# Patient Record
Sex: Female | Born: 1940 | Race: Black or African American | Hispanic: No | Smoking: Never smoker
Health system: Southern US, Community
[De-identification: ages and names within clinical notes are randomized; demographics above are authoritative.]

## PROBLEM LIST (undated history)

## (undated) DIAGNOSIS — Z8489 Family history of other specified conditions: Secondary | ICD-10-CM

## (undated) DIAGNOSIS — J309 Allergic rhinitis, unspecified: Secondary | ICD-10-CM

## (undated) DIAGNOSIS — I4891 Unspecified atrial fibrillation: Secondary | ICD-10-CM

## (undated) DIAGNOSIS — M4800 Spinal stenosis, site unspecified: Secondary | ICD-10-CM

## (undated) DIAGNOSIS — R350 Frequency of micturition: Secondary | ICD-10-CM

## (undated) DIAGNOSIS — M179 Osteoarthritis of knee, unspecified: Secondary | ICD-10-CM

## (undated) DIAGNOSIS — M1710 Unilateral primary osteoarthritis, unspecified knee: Secondary | ICD-10-CM

## (undated) DIAGNOSIS — I10 Essential (primary) hypertension: Secondary | ICD-10-CM

## (undated) DIAGNOSIS — I129 Hypertensive chronic kidney disease with stage 1 through stage 4 chronic kidney disease, or unspecified chronic kidney disease: Secondary | ICD-10-CM

## (undated) DIAGNOSIS — G5602 Carpal tunnel syndrome, left upper limb: Secondary | ICD-10-CM

## (undated) DIAGNOSIS — E039 Hypothyroidism, unspecified: Secondary | ICD-10-CM

## (undated) DIAGNOSIS — D649 Anemia, unspecified: Secondary | ICD-10-CM

## (undated) DIAGNOSIS — IMO0001 Reserved for inherently not codable concepts without codable children: Secondary | ICD-10-CM

## (undated) DIAGNOSIS — N189 Chronic kidney disease, unspecified: Secondary | ICD-10-CM

## (undated) HISTORY — DX: Hypertensive chronic kidney disease with stage 1 through stage 4 chronic kidney disease, or unspecified chronic kidney disease: I12.9

## (undated) HISTORY — DX: Osteoarthritis of knee, unspecified: M17.9

## (undated) HISTORY — DX: Unspecified atrial fibrillation: I48.91

## (undated) HISTORY — DX: Chronic kidney disease, unspecified: N18.9

## (undated) HISTORY — PX: COLONOSCOPY W/ BIOPSIES AND POLYPECTOMY: SHX1376

## (undated) HISTORY — DX: Allergic rhinitis, unspecified: J30.9

## (undated) HISTORY — DX: Spinal stenosis, site unspecified: M48.00

## (undated) HISTORY — DX: Unilateral primary osteoarthritis, unspecified knee: M17.10

## (undated) HISTORY — PX: OTHER SURGICAL HISTORY: SHX169

---

## 1997-05-15 ENCOUNTER — Encounter: Admission: RE | Admit: 1997-05-15 | Discharge: 1997-08-13 | Payer: Self-pay | Admitting: Internal Medicine

## 1997-08-05 ENCOUNTER — Encounter: Admission: RE | Admit: 1997-08-05 | Discharge: 1997-10-24 | Payer: Self-pay | Admitting: Internal Medicine

## 1997-09-01 ENCOUNTER — Encounter: Admission: RE | Admit: 1997-09-01 | Discharge: 1997-11-30 | Payer: Self-pay | Admitting: Internal Medicine

## 1997-10-24 ENCOUNTER — Encounter: Admission: RE | Admit: 1997-10-24 | Discharge: 1998-01-22 | Payer: Self-pay | Admitting: Internal Medicine

## 1997-10-27 ENCOUNTER — Encounter: Admission: RE | Admit: 1997-10-27 | Discharge: 1997-10-27 | Payer: Self-pay | Admitting: Sports Medicine

## 1997-12-23 ENCOUNTER — Encounter: Payer: Self-pay | Admitting: *Deleted

## 1998-06-15 ENCOUNTER — Ambulatory Visit (HOSPITAL_COMMUNITY): Admission: RE | Admit: 1998-06-15 | Discharge: 1998-06-15 | Payer: Self-pay | Admitting: Family Medicine

## 1999-09-29 ENCOUNTER — Encounter: Payer: Self-pay | Admitting: Family Medicine

## 1999-09-29 ENCOUNTER — Encounter: Admission: RE | Admit: 1999-09-29 | Discharge: 1999-09-29 | Payer: Self-pay | Admitting: Family Medicine

## 1999-12-08 ENCOUNTER — Encounter: Payer: Self-pay | Admitting: Orthopedic Surgery

## 1999-12-20 ENCOUNTER — Inpatient Hospital Stay (HOSPITAL_COMMUNITY): Admission: RE | Admit: 1999-12-20 | Discharge: 1999-12-23 | Payer: Self-pay | Admitting: Orthopedic Surgery

## 1999-12-23 ENCOUNTER — Inpatient Hospital Stay (HOSPITAL_COMMUNITY)
Admission: RE | Admit: 1999-12-23 | Discharge: 1999-12-30 | Payer: Self-pay | Admitting: Physical Medicine & Rehabilitation

## 2001-07-14 ENCOUNTER — Inpatient Hospital Stay (HOSPITAL_COMMUNITY): Admission: EM | Admit: 2001-07-14 | Discharge: 2001-07-16 | Payer: Self-pay

## 2001-08-08 ENCOUNTER — Encounter: Payer: Self-pay | Admitting: Family Medicine

## 2001-08-08 ENCOUNTER — Ambulatory Visit (HOSPITAL_COMMUNITY): Admission: RE | Admit: 2001-08-08 | Discharge: 2001-08-08 | Payer: Self-pay | Admitting: Family Medicine

## 2001-10-08 ENCOUNTER — Encounter: Payer: Self-pay | Admitting: Neurosurgery

## 2001-10-08 ENCOUNTER — Ambulatory Visit (HOSPITAL_COMMUNITY): Admission: RE | Admit: 2001-10-08 | Discharge: 2001-10-08 | Payer: Self-pay | Admitting: Neurosurgery

## 2001-10-29 ENCOUNTER — Encounter: Payer: Self-pay | Admitting: Neurosurgery

## 2001-10-29 ENCOUNTER — Ambulatory Visit (HOSPITAL_COMMUNITY): Admission: RE | Admit: 2001-10-29 | Discharge: 2001-10-29 | Payer: Self-pay | Admitting: Neurosurgery

## 2002-04-06 ENCOUNTER — Ambulatory Visit (HOSPITAL_COMMUNITY): Admission: RE | Admit: 2002-04-06 | Discharge: 2002-04-06 | Payer: Self-pay | Admitting: Neurosurgery

## 2002-04-06 ENCOUNTER — Encounter: Payer: Self-pay | Admitting: Neurosurgery

## 2002-12-05 ENCOUNTER — Ambulatory Visit (HOSPITAL_COMMUNITY): Admission: RE | Admit: 2002-12-05 | Discharge: 2002-12-05 | Payer: Self-pay | Admitting: Neurosurgery

## 2002-12-05 ENCOUNTER — Encounter: Payer: Self-pay | Admitting: Neurosurgery

## 2003-06-17 ENCOUNTER — Ambulatory Visit (HOSPITAL_COMMUNITY): Admission: RE | Admit: 2003-06-17 | Discharge: 2003-06-17 | Payer: Self-pay | Admitting: Neurosurgery

## 2003-10-10 ENCOUNTER — Other Ambulatory Visit: Admission: RE | Admit: 2003-10-10 | Discharge: 2003-10-10 | Payer: Self-pay | Admitting: Family Medicine

## 2003-12-12 ENCOUNTER — Ambulatory Visit (HOSPITAL_COMMUNITY): Admission: RE | Admit: 2003-12-12 | Discharge: 2003-12-12 | Payer: Self-pay | Admitting: Gastroenterology

## 2003-12-12 ENCOUNTER — Encounter (INDEPENDENT_AMBULATORY_CARE_PROVIDER_SITE_OTHER): Payer: Self-pay | Admitting: *Deleted

## 2004-06-28 ENCOUNTER — Ambulatory Visit (HOSPITAL_COMMUNITY): Admission: RE | Admit: 2004-06-28 | Discharge: 2004-06-28 | Payer: Self-pay | Admitting: Neurosurgery

## 2005-02-23 ENCOUNTER — Other Ambulatory Visit: Admission: RE | Admit: 2005-02-23 | Discharge: 2005-02-23 | Payer: Self-pay | Admitting: Family Medicine

## 2005-03-10 ENCOUNTER — Ambulatory Visit (HOSPITAL_COMMUNITY): Admission: RE | Admit: 2005-03-10 | Discharge: 2005-03-10 | Payer: Self-pay | Admitting: Family Medicine

## 2005-07-06 ENCOUNTER — Ambulatory Visit (HOSPITAL_COMMUNITY): Admission: RE | Admit: 2005-07-06 | Discharge: 2005-07-06 | Payer: Self-pay | Admitting: Neurosurgery

## 2006-07-04 ENCOUNTER — Ambulatory Visit (HOSPITAL_COMMUNITY): Admission: RE | Admit: 2006-07-04 | Discharge: 2006-07-04 | Payer: Self-pay | Admitting: Neurosurgery

## 2007-03-28 ENCOUNTER — Encounter: Admission: RE | Admit: 2007-03-28 | Discharge: 2007-03-28 | Payer: Self-pay | Admitting: Family Medicine

## 2007-06-22 ENCOUNTER — Ambulatory Visit (HOSPITAL_COMMUNITY): Admission: RE | Admit: 2007-06-22 | Discharge: 2007-06-22 | Payer: Self-pay | Admitting: Neurosurgery

## 2008-04-14 ENCOUNTER — Encounter: Admission: RE | Admit: 2008-04-14 | Discharge: 2008-04-14 | Payer: Self-pay | Admitting: Family Medicine

## 2008-06-17 ENCOUNTER — Other Ambulatory Visit: Admission: RE | Admit: 2008-06-17 | Discharge: 2008-06-17 | Payer: Self-pay | Admitting: Family Medicine

## 2008-06-27 ENCOUNTER — Encounter: Payer: Self-pay | Admitting: Cardiology

## 2008-07-03 ENCOUNTER — Encounter: Payer: Self-pay | Admitting: Cardiology

## 2008-08-13 ENCOUNTER — Ambulatory Visit (HOSPITAL_COMMUNITY): Admission: RE | Admit: 2008-08-13 | Discharge: 2008-08-13 | Payer: Self-pay | Admitting: Neurosurgery

## 2009-04-22 ENCOUNTER — Encounter: Admission: RE | Admit: 2009-04-22 | Discharge: 2009-04-22 | Payer: Self-pay | Admitting: Family Medicine

## 2010-02-19 ENCOUNTER — Encounter (HOSPITAL_BASED_OUTPATIENT_CLINIC_OR_DEPARTMENT_OTHER)
Admission: RE | Admit: 2010-02-19 | Discharge: 2010-03-16 | Payer: Self-pay | Source: Home / Self Care | Attending: General Surgery | Admitting: General Surgery

## 2010-03-19 ENCOUNTER — Encounter (HOSPITAL_BASED_OUTPATIENT_CLINIC_OR_DEPARTMENT_OTHER): Payer: Medicare Other | Attending: General Surgery

## 2010-03-19 DIAGNOSIS — I872 Venous insufficiency (chronic) (peripheral): Secondary | ICD-10-CM | POA: Insufficient documentation

## 2010-03-19 DIAGNOSIS — L97809 Non-pressure chronic ulcer of other part of unspecified lower leg with unspecified severity: Secondary | ICD-10-CM | POA: Insufficient documentation

## 2010-04-16 ENCOUNTER — Encounter (HOSPITAL_BASED_OUTPATIENT_CLINIC_OR_DEPARTMENT_OTHER): Payer: Medicare Other | Attending: General Surgery

## 2010-04-16 DIAGNOSIS — L97809 Non-pressure chronic ulcer of other part of unspecified lower leg with unspecified severity: Secondary | ICD-10-CM | POA: Insufficient documentation

## 2010-04-16 DIAGNOSIS — I872 Venous insufficiency (chronic) (peripheral): Secondary | ICD-10-CM | POA: Insufficient documentation

## 2010-05-24 LAB — CREATININE, SERUM: GFR calc Af Amer: >60 mL/min (ref >60)

## 2010-05-28 ENCOUNTER — Encounter (HOSPITAL_BASED_OUTPATIENT_CLINIC_OR_DEPARTMENT_OTHER): Payer: Medicare Other | Attending: General Surgery

## 2010-05-28 DIAGNOSIS — I872 Venous insufficiency (chronic) (peripheral): Secondary | ICD-10-CM | POA: Insufficient documentation

## 2010-05-28 DIAGNOSIS — L97809 Non-pressure chronic ulcer of other part of unspecified lower leg with unspecified severity: Secondary | ICD-10-CM | POA: Insufficient documentation

## 2010-06-02 ENCOUNTER — Other Ambulatory Visit: Payer: Self-pay | Admitting: Family Medicine

## 2010-06-02 DIAGNOSIS — Z1231 Encounter for screening mammogram for malignant neoplasm of breast: Secondary | ICD-10-CM

## 2010-06-10 ENCOUNTER — Other Ambulatory Visit (HOSPITAL_COMMUNITY): Payer: Self-pay | Admitting: Neurosurgery

## 2010-06-10 DIAGNOSIS — D496 Neoplasm of unspecified behavior of brain: Secondary | ICD-10-CM

## 2010-06-30 ENCOUNTER — Ambulatory Visit
Admission: RE | Admit: 2010-06-30 | Discharge: 2010-06-30 | Disposition: A | Discharge status: Home / Self Care | Payer: Medicare Other | Source: Ambulatory Visit | Attending: Family Medicine | Admitting: Family Medicine

## 2010-06-30 DIAGNOSIS — Z1231 Encounter for screening mammogram for malignant neoplasm of breast: Secondary | ICD-10-CM

## 2010-07-02 NOTE — Discharge Summary (Signed)
[place]. [facility]  Patient:    [name], Jasmine Hamilton Visit Number: [id number] MRN: [id number]          Service Type: MED Location: [phone] Attending Physician:  Osei-Bonsu, George Dictated by:   George Osei-Bonsu, M.D. Admit Date:  07/14/2001 Discharge Date: 07/16/2001                             Discharge Summary  ADDENDUM  HOSPITAL COURSE: Initially the patient was hypotensive when she came in here. Her blood pressure is 118/17 mmHg. She did not have any acute vision changes.  The patient was hypokalemic during the hospitalization with the last potassium being 3.0. She received K-Dur but we could not repeat potassium prior to discharge. We believe that during her follow up in the outpatient setting, her potassium will be within normal limits since this is thought to be related to her vomiting which is now abated. The patient will need outpatient follow up of her potassium level. Dictated by:   George Osei-Bonsu, M.D. Attending Physician:  Osei-Bonsu, George DD:  07/16/01 TD:  07/17/01 Job: [id number]  

## 2010-07-02 NOTE — Discharge Summary (Signed)
[place]. [facility]  Patient:    Jasmine Hamilton, Jasmine Hamilton Visit Number: [id number] MRN: [id number]          Service Type: MED Location: [phone] Attending Physician:  Jackie Plum Dictated by:   Jackie Plum, M.D. Admit Date:  07/14/2001 Discharge Date: 07/16/2001   CC:         [place]. Cliffton Asters, M.D., [facility]   Discharge Summary  DISCHARGE DIAGNOSES: 1. Acute severe vertigo with nausea and vomiting - resolved, probably    secondary to benign positional vertigo.    a. Head CT done on admission was negative for any acute intracranial       process.    b. MRI with MRA done on July 15, 2001 stable for:       (1) Mild age-related atrophy with mild to moderate nonspecific white           matter type changes without any evidence of acute infarct.       (2) A 9 mm enhancing suprasellar lesion.  Followup with 10 section           postcontrast images in all three planes recommended.       (3) Right internal carotid artery stenosis - about 40%.       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 2. Abnormal MRI/MRA - see reports above with respect to 9 mm suprasellar    enhancing lesion.  The patient will need outpatient followup of this    lesion with 10 sections of postcontrast imaging. 3. Right internal carotid artery stenosis, 40%.  The patient will need    intermittent followup of this lesion. 4. Abnormal chest x-ray - admitting chest x-ray notable for hilar fullness.    The patient will need outpatient comparison with PA and lateral films and    chest CT as needed for this abnormality. 5. Hypertension. 6. Leukopenia without any evidence of infection, etiology unclear, outpatient    followup. 7. History of chronic lobar pain. 8. History of degenerative joint disease of the right knee status post total    knee replacement in November 2001. 9. Allergy to CODEINE (tongue swells up).  DISCHARGE MEDICATIONS:  The  patient is to restart all her preadmission medications as she was taking them prior to admission.  In addition, she was started on Antivert 25 mg tablets one to two tablets by mouth two to three times daily for severe vertigo.  ACTIVITIES:  As tolerated.  DIET:  Low salt diet as usual prior to admission.  SPECIAL INSTRUCTIONS:  She is to report to MD if she experiences any worsening of her dizziness.  FOLLOWUP:  Followup appointment will be with Dr. Laurann Montana within 2 weeks. She is to call for appointment.  HISTORY OF PRESENT ILLNESS/HOSPITAL COURSE:  A 70 year old patient was admitted by Dr. Anastasio Auerbach on Jul 14, 2001 after presenting with severe dizziness with vertiginous signature associated with nausea and vomiting without any chest pain or shortness of breath.  There was no motor or sensory deficit.  The patient was treated at the ED with Valium without any relief and was subsequently admitted for further evaluation.  The patient was orthostatic by pulse and her systolic BP was 191/51 on admission.  She was not clinically dehydrated.  Lung exam and cardiac exam were unremarkable.  CNS exam was notable for absence of any acute neurologic deficit.  #1 - VERTIGO:  The patient was admitted to telemetry unit  for symptomatic management of her severe vertigo and also to rule out any possible posterior circulation CVA.  She was initiated on IV fluid supplementation for the orthostatic pulse with good results.  She was started also on Antivert with good resolution of her symptoms over 48 hours of admission.  CT was negative for an acute intracranial pathology as mentioned above but on account of her symptoms, we thought it was prudent to rule out a posterior circulation CVA by MRI which was negative.  The patient describes symptoms to be aggravated by sharp movements of her head.  She also gave history of tinnitus and we believe that the patient may have benign positional vertigo  versus Menieres disease. We hope that this can be evaluated further as outpatient.  We started her on Antivert for symptomatic treatment of her symptomatology.  #2 - ABNORMAL CHEST X-RAY:  During routine evaluation the patients x-rays showed hilar fullness.  Recommend that the patient be evaluated further with PA and lateral chest x-ray as an outpatient and if this is abnormal may be followed up with chest CT as appropriate.  #3 - ABNORMAL MRI/MRA:  On account of her symptoms MRI/MRA was obtained. There was an incidental finding of a suprasellar enhancing lesion as mentioned above.  We recommend outpatient 10 sections with contrast study to evaluate this lesion further.  During hospitalization, the patient did not indicate any evidence of neurologic symptomatology.  She was neurologically intact. Dictated by:   Jackie Plum, M.D. Attending Physician:  Jackie Plum DD:  07/16/01 TD:  07/18/01 Job: [id number]

## 2010-07-02 NOTE — Discharge Summary (Signed)
[facility]  Patient:    Jasmine Hamilton, Jasmine Hamilton                  MRN: [id number] Adm. Date:  [id number] Disc. Date: 12/23/99 Attending:  Loanne Drilling Dictator:   Alexzandrew L. Perkins, P.A.-C.                           Discharge Summary  ADMITTING DIAGNOSES: 1. Osteoarthritis, right knee. 2. Hypertension. 3. Generalized osteoarthritis.  DISCHARGE DIAGNOSES: 1. Osteoarthritis, right knee status post right total knee replacement    arthroplasty. 2. Hypertension. 3. Generalized osteoarthritis. 4. Mild postoperative anemia.  PROCEDURE:  The patient was taken to the OR on December 20, 1999 and underwent a right total knee replacement arthroplasty. Surgeon was Dr. Ollen Gross, assistant Maud Deed, P.A.-C. under spinal anesthesia. A Hemovac drain x 1 placed at the time of surgery. Tourniquet time was 61 minutes at 350 mmHg.  CONSULTS:  Rehabilitation services.  BRIEF HISTORY:  The patient is a 70 year old female with a several year history of progressing right knee pain and varus deformity. She has been seen and evaluated and treated for knee pain on an outpatient conservative basis; however, she has continued to have progressive type pain. X-rays taken in the office show bone on bone abutment with severe medial compartment arthritis. It is felt she has progressed to the point where it is felt she would benefit from undergoing surgical intervention. The risks and benefits of the procedure were discussed with the patient and she elected to proceed with surgery. The patient was subsequently to [facility].  LABORATORY DATA:  CBC on admission showed a hemoglobin of 11.7, hematocrit of 34.0, white cell count 3.6, red cell count 3.97. Serial H&Hs were followed throughout the hospital course. Postop hemoglobin dropped to a level of 9.8 with hematocrit at 28.4. Last noted H&H was 9.9 with a hematocrit of 28.7. PT and PTT on admission were 13.6  and 25 respectively. Serial pro-times followed per Coumadin protocol. Last noted PT and INR were 17.9 with an INR of 1.8. Urinalysis on admission was negative. Blood group type B+.  X-RAYS:  Chest x-ray preoperatively dated December 08, 1999 findings suggest slight cardiomegaly with prominent markings but without acute abnormalities of the chest. EKG dated December 08, 1999 significant for normal sinus rhythm, right bundle branch block, no old tracings to compare. This was confirmed by Dr. Olga Millers.  HOSPITAL COURSE:  The patient was admitted to [facility], taken to the OR and underwent the above stated procedure without complications. The patient tolerated the procedure well and was later transferred to the recovery room and then to the orthopedic floor to continue post care. A Hemovac drain was placed at the time of surgery. The drain was removed on postoperative day 1 without difficulty. Serial H&Hs were followed throughout the hospital course. The patient had a slight drop in hemoglobin; however, remained hemodynamically stable and did not require transfusion during the hospital course. She was placed on Coumadin postoperatively. Physical therapy was ordered postoperatively. The patient was slow to progress with physical therapy only ambulating approximately 20 feet by postoperative day 2 with minimal assists. Rehab consult was called. The patient was seen and evaluated by rehab services. It was felt that she would benefit from undergoing an inpatient rehabilitation stay. Continued to progress. Dressings change was initiated on postoperative day 2, wound was healing well. No complaints. By December 23, 1999  was still progressing very well with therapy. It was noted a bed became available on the [facility]. This was discussed with the patient and she was in agreement with being transferred.  DISCHARGE PLAN:   The patient is to be transferred over to [facility].  DISCHARGE DIAGNOSES:  Please see above.  DISCHARGE MEDICATIONS:  Current medications at the time of transfer include Colace 100 mg p.o. b.i.d., trinsicon 1 p.o. b.i.d., Cardura 4 mg q.d., cozaar 10 mg p.o. q.d., hydrochlorothiazide 25 mg p.o. q.d., E-vista 60 mg p.o. q.d., Toprol XL 50 mg p.o. b.i.d., Tylenol 1 or 2 every 4-6h as needed for pain, Robaxin 500 mg 1 p.o. q. 6h p.r.n. spasm, Coumadin as per pharmacy protocol. This should be used like Mepergan fortis 1 or 2 every 4-6h as needed for pain.  DISCHARGE DIET:  Low sodium diet.  DISCHARGE ACTIVITY:  The patient is weightbearing as tolerated to the right lower extremity. Continue with gait training ambulation. Range of motion, strength and exercise as per physical therapy. Occupational therapy as needed while in the rehab unit. Continue with wound observation and dressing change daily.  FOLLOW-UP:  The patient is to follow-up with Dr. Lequita Halt in the office 2 weeks from the date of surgery or following discharge from the [facility].  DISPOSITION:  [facility].  CONDITION ON DISCHARGE:  Improved. DD:  12/23/99 TD:  12/23/99 Job: [id number]

## 2010-07-02 NOTE — Op Note (Signed)
[facility]  Patient:    Jasmine Hamilton, Jasmine Hamilton                  MRN: [id number] Proc. Date: 12/20/99 Adm. Date:  [id number] Attending:  Ollen Gross V                           Operative Report  PREOPERATIVE DIAGNOSIS:  Osteoarthritis, right knee.  POSTOPERATIVE DIAGNOSIS:  Osteoarthritis, right knee.  OPERATION:  Right total knee arthroplasty.  SURGEON:  Trudee Grip, M.D.  ASSISTANT:  Della Goo, P.A.  ANESTHESIA:  Spinal.  ESTIMATED BLOOD LOSS:  Minimal.  DRAIN:  Hemovac x 1.  TOURNIQUET TIME:  61 minutes at 350 mmHg.  COMPLICATIONS:  None.  CONDITION:  Stable, to recovery.  BRIEF CLINICAL NOTE:  Ms. Bridwell is a 70 year old with a several year history of progressively increasing right knee pain and varus deformity.  She has severe osteoarthritis, medial compartment patellofemoral.  She presents now for right total knee arthroplasty.  PROCEDURE IN DETAIL:  After successful administration of spinal anesthetic, a tourniquet was placed high on her right thigh and right lower extremity prepped and draped in usual sterile fashion.  Extremity was wrapped in Esmarch, knee flexed, and tourniquet inflated to 400 mmHg given the morbid obesity especially involving the thigh.  The incision was made with the knee flexed  with a #10 blade.  The incision is made down to the extensor mechanism.   A fresh blade was used to make a medial parapatellar arthrotomy. Soft tissue over medial proximal tibial subperiosteally elevated to joint line with a knife and into the descending membranous bursa with curved osteotome. Soft tissue over the lateral tibia was also elevated with attention being paid to avoid the patellar tendon and tibial tubercle.  The lateral patellar femoral ligament is cut, patella everted, and knee flexed 90 degrees. ACL and PCL were then removed.  The drill was then used to create a hole in the distal femur.  The canal was then  irrigated and the alignment rod placed in 5 degrees of valgus. Referencing of the posterior condyle, rotation was marked in black pen so as to remove 9 mm from the distal femur.  Distal femoral resection was then made with an oscillating saw.  The sizer block is placed, and size 4 was the most appropriate.  The rotation is placed at 3 degrees external that corresponded with the epicondylar axis.  The anterior posterior block is placed, and the anterior and posterior cuts made.  The tibia [place] subluxed forward and the menisci removed.  Extramedullary tibial alignment guide is placed referencing at the medial aspect of the tibial tubercle and then distally along the tibial crest.  She had a fixed valgus and external rotation deformity of the foot.  Thus, we did not use the second metatarsal axis as it would have placed her in a tremendous amount of varus. The rotation is marked in a black pen so as to remove 10 mm off the nondeficient lateral side.  Resection is made with an oscillating saw.  A size 4 is the most appropriate tibia also.  The intercondylar and chamfer cuts are then made on the femur.  Size 4 posterior stabilized femur is placed with a size 4 tibial tray.  First a 10 mm posterior stabilizer is inserted.  She got full extension, but there was about 2 mm of varus valgus play with the 10, so we  put a 12.5 in, and she had great balance all the way down through full left flexion.   The rotation is marked and corresponds with the tibial crest distally.  The rotation is then marked. The patella is then everted, thickness measured to be 26 cm, and free hand resection taken out of the 15 mm.  The template of 38 mm patella was placed, and the lug holes are drilled.  The trial patella is placed and tracks normally.  The trial tibia and patella are removed, and then the osteophytes are removed at posterior femur with the components in place.  That trial is removed, and then the tibia tray  is placed and pinned and then a keel punch and modular drill utilized to repair the proximal tibia.  Cut bone surfaces are then prepared with pulsatile lavage, and the size 4 tibia, the size 4 femur, and the 38 mm patella are all cemented in place and patella held with a clamp.  A 12.5 mm spacer is placed and knee held in full extension, extruded cement removed.  Once cement is fully hardened, then the permanent size 4, 12.5 mm posterior stabilizing insert is impacted into the tibial tray.  The wound is then copiously irrigated with antibiotic solution.  The tourniquet is let down for a total time of 61 minutes. Minor bleeding is stopped with cautery.   The extensor mechanism is then closed over limb of the Hemovac drain with interrupted #1 PDS.  Subcutaneous tissue is closed in two layers with 2-0 Vicryl and then subcuticular with running 3-0 Monocryl.   Incision is clean and dry, and Steri-Strips and bulky sterile dressing applied.  The drains hooked to suction and knee placed in an immobilizer.  The patient awakened, and transferred to recovery in stable condition.DD:  12/20/99 TD:  12/20/99 Job: [id number]

## 2010-07-02 NOTE — Discharge Summary (Signed)
[place]. [facility]  Patient:    Jasmine Hamilton, Jasmine Hamilton Visit Number: [id number] MRN: [id number]          Service Type: MED Location: [phone] Attending Physician:  Osei-Bonsu, George Dictated by:   George Osei-Bonsu, M.D. Admit Date:  07/14/2001 Discharge Date: 07/16/2001                             Discharge Summary  ADDENDUM  HOSPITAL COURSE: Initially the patient was hypotensive when she came in here. Her blood pressure is 118/17 mmHg. She did not have any acute vision changes.  The patient was hypokalemic during the hospitalization with the last potassium being 3.0. She received K-Dur but we could not repeat potassium prior to discharge. We believe that during her follow up in the outpatient setting, her potassium will be within normal limits since this is thought to be related to her vomiting which is now abated. The patient will need outpatient follow up of her potassium level. Dictated by:   George Osei-Bonsu, M.D. Attending Physician:  Osei-Bonsu, George DD:  07/16/01 TD:  07/17/01 Job: [id number]  

## 2010-07-02 NOTE — Discharge Summary (Signed)
[place]. [facility]  Patient:    Jasmine Hamilton, Jasmine Hamilton Visit Number: [id number] MRN: [id number]          Service Type: MED Location: [phone] Attending Physician:  Osei-Bonsu, George Dictated by:   George Osei-Bonsu, M.D. Admit Date:  07/14/2001 Discharge Date: 07/16/2001                             Discharge Summary  ADDENDUM  HOSPITAL COURSE: Initially the patient was hypotensive when she came in here. Her blood pressure is 118/17 mmHg. She did not have any acute vision changes.  The patient was hypokalemic during the hospitalization with the last potassium being 3.0. She received K-Dur but we could not repeat potassium prior to discharge. We believe that during her follow up in the outpatient setting, her potassium will be within normal limits since this is thought to be related to her vomiting which is now abated. The patient will need outpatient follow up of her potassium level. Dictated by:   George Osei-Bonsu, M.D. Attending Physician:  Osei-Bonsu, George DD:  07/16/01 TD:  07/17/01 Job: [id number]  

## 2010-07-02 NOTE — Discharge Summary (Signed)
[place]. [facility]  Patient:    Jasmine Hamilton, Jasmine Hamilton Visit Number: [id number] MRN: [id number]          Service Type: MED Location: [phone] Attending Physician:  Jackie Plum Dictated by:   Jackie Plum, M.D. Admit Date:  07/14/2001 Discharge Date: 07/16/2001                             Discharge Summary  ADDENDUM  HOSPITAL COURSE: Initially the patient was hypotensive when she came in here. Her blood pressure is 118/17 mmHg. She did not have any acute vision changes.  The patient was hypokalemic during the hospitalization with the last potassium being 3.0. She received K-Dur but we could not repeat potassium prior to discharge. We believe that during her follow up in the outpatient setting, her potassium will be within normal limits since this is thought to be related to her vomiting which is now abated. The patient will need outpatient follow up of her potassium level. Dictated by:   Jackie Plum, M.D. Attending Physician:  Jackie Plum DD:  07/16/01 TD:  07/17/01 Job: [id number]

## 2010-07-02 NOTE — Discharge Summary (Signed)
[place]. [facility]  Patient:    Jasmine Hamilton, FAETH                  MRN: [id number] Adm. Date:  [id number] Disc. Date: [id number] Attending:  Faith Rogue T Dictator:   Dian Situ, PA CC:         Elana Alm. Eliezer Lofts., M.D.  Trudee Grip, M.D.   Discharge Summary  DISCHARGE DIAGNOSES: 1. Status post right total knee replacement. 2. Klebsiella urinary tract infection. 3. Postoperative anemia. 4. Hypokalemia, resolved.  HISTORY OF PRESENT ILLNESS:  Ms. Klepacki is a 70 year old female with history of hypertension, OA right knee, with progressive knee pain.  She has had no relief with conservative therapy and elected to undergo right total knee replacement December 20, 1999, by Dr. Lequita Halt.  Postoperatively, is weightbearing as tolerated and on Coumadin for DVT prophylaxis, with INR trending upwards to 1.8 today.  Postoperatively, pain is currently being controlled with Demerol.  She has had problems with hypokalemia, requiring supplementation.  Last potassium level of December 21, 1999, was at 3.3. Currently, the patient is at min assist for bed mobility, min assist to steady assist for ambulating 20 feet with wide walker.  Rehabilitation consulted with progressive independence goals.  PAST MEDICAL HISTORY: 1. Hypertension. 2. OA.  ALLERGIES:  CODEINE.  SOCIAL HISTORY:  The patient lives with daughter in one-level home with six steps at entry.  Was independent prior to admission.  She does not use any tobacco or alcohol.  HOSPITAL COURSE:  Ms. Meztli Llanas was admitted to rehabilitation on December 23, 1999, for inpatient therapies to consist of PT, OT daily.  Past admission, the patient was maintained on iron supplement for postoperative anemia, Coumadin for DVT prophylaxis.  Initially, she was started with Darvocet for pain control.  However, this provided little relief, and the patient was started on OxyContin CR for more  consistent pain control.  As there was question of urinary retention, PVRs x 3 were checked and have shown low volumes.  A UA/UCS was sent off at admission, and this grew greater than 100,000 colonies of Klebsiella pneumoniae.  She was treated with a seven-day course of Levaquin for this.  Other laboratories at admission showed the patient to be hypokalemic with potassium at 2.6.  She was supplemented, and this has been rechecked, last of December 28, 1999, shows sodium 138, potassium 3.9, chloride 99, CO2 30, BUN 10, creatinine 0.6, glucose 97.  Her H&H has been stable at 9.0 and 26.4.  Stool guaiac x 1 was checked and has been negative.  The patient was maintained on Coumadin for DVT prophylaxis with PT/INR at discharge at 24.0 and 3.0.  The patient was discharged on 5 mg a day of Coumadin to continue for three additional weeks.  Home health R.N. has been arranged to draw pro times, next on January 03, 2000, with the results to Dr. Riley Kill.  The patients knee incision has healed well without any signs or symptoms of infection.  No drainage, no erythema noted.  The patients knee flexion has been an issue during this stay with flexion being limited to 65 degrees.  A home CPM was ordered, and the patient has been encouraged to do her knee exercises to help with quad strengthening.  In terms of ADLs, the patient is modified independent for upper body care, requires some supervision with cues for lower body dressing.  She is modified independent for toileting, modified independent for transfers, modified independent  for ambulating 150 feet with standard walker.  Able to climb eight steps bilaterally with distant supervision.  Follow-up therapies to include home health PT to continue by [facility] services.  On December 30, 1999, the patient is discharged to home.  DISCHARGE MEDICATIONS:  1. Coumadin 5 mg p.o. per day.  2. Cardura 4 mg per day.  3. Hyzaar 50/25 per day.  4.  ______ 60 mg per day.  5. Trinsicon 1 p.o. b.i.d.  6. Pepcid 20 mg b.i.d.  7. Toprol XL 50 mg per day.  8. Oxycodone CR 1 p.o. q.12h., decrease to 1 p.o. per day in seven days.  9. Oxycodone IR 1-2 p.o. q.4-6h. p.r.n. pain. 10. Levaquin 250 mg p.o. per day x 3 days. 11. Nasonex 1 squirt in nostril per day.  ACTIVITY:  To use walker.  DIET:  Regular.  WOUND CARE:  Keep area clean and dry.  FOLLOW-UP:  To follow up with Dr. Lequita Halt for postoperative check.  Follow up with Dr. Riley Kill as needed.DD:  01/10/00 TD:  01/10/00 Job: [id number]

## 2010-07-02 NOTE — H&P (Signed)
[facility]  Patient:    Jasmine Hamilton, Jasmine Hamilton                    MRN: [id number] Adm. Date:  12/20/99 Attending:  Ollen Gross, M.D. Dictator:   Ralene Bathe, P.A. CC:         Elias Else, M.D. [facility], [address]                         History and Physical  DATE OF HISTORY AND PHYSICAL:  December 08, 1999.  CHIEF COMPLAINT:  Right knee pain.  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 She has failed conservative measures, including right knee arthroscopy and clean out versus injections. She had also previously been scheduled for total knee replacement. However, she was having uncontrolled hypertension; and thus, surgery was postponed. She has been followed medically, and her blood pressure stabilized. Therefore, she wishes to proceed. On physical examination, she has significant varus deformity and range of motion is 0 to 105 with marked crepitus. X-rays show bone-on-bone abutment with severe medial component arthritis. Also significant patellofemoral compartment arthritis. Risks, benefits of surgery discussed with the patient. She wishes to proceed with total knee arthroplasty.  PAST MEDICAL HISTORY: 1. Hypertension. 2. Osteoarthritis.  PAST SURGICAL HISTORY:  Right knee arthroscopy.  ALLERGIES:  CODEINE cause tongue swelling.  CURRENT MEDICATIONS: 1. Cardura 4 mg q.h.s. 2. Hyzaar 100/25 mg q.a.m. 3. Toprol XL 50 mg b.i.d. 4. Ultram 50 one q.4-6h. p.r.n. pain. 5. Evista 60 mg q.d. 6. Tylenol p.r.n.  FAMILY HISTORY:  Significant for hypertension, diabetes, and heart disease, as well as strokes.  SOCIAL HISTORY:  The patient lives with her daughter. Lives in a [place] with six steps into her usual entrance. No alcohol use. No tobacco. The patients medical physician is Molly Maduro A. Eliezer Lofts., M.D., [facility] who has been following her for her blood pressure control.  REVIEW OF SYSTEMS:  The patient denies any recent fevers, chills, night sweats. No bleeding tendencies. CNS:  No blurred or double vision, seizures, or paralysis. RESPIRATORY:  No shortness of breath, productive cough, or hemoptysis. CARDIOVASCULAR:  No chest pain, angina, orthopnea. GI:  No nausea, vomiting, constipation, melena, bloody stools. GENITOURINARY:  No dysuria or hematuria. MUSCULOSKELETAL:  As pertinent to present illness.  PHYSICAL EXAMINATION:  GENERAL:  Well-developed, well-nourished, mildly obese, 70 year old female.  VITAL SIGNS:  Blood pressure 160/76 today.  HEENT:  Normocephalic. Extraocular motions intact.  NECK:  Supple. No lymphadenopathy. No carotid bruits.  CHEST:  Clear to auscultation. No rales or rhonchi.  HEART:  Regular rate and rhythm. No murmurs, rubs, gallops, heaves, or thrills.  ABDOMEN:  Obese, soft, nontender.  EXTREMITIES:  Positive distal pulses. Right knee is as in the history of present illness.  IMPRESSION: 1. Osteoarthritis right knee. 2. Hypertension. 3. Osteoarthritis.  PLAN:  Proceed with right total knee arthroplasty at this point. Her medical physician Robert A. Eliezer Lofts., M.D. has provided medical clearance. DD:  12/14/99 TD:  12/14/99 Job: [id number]

## 2010-07-05 ENCOUNTER — Ambulatory Visit (HOSPITAL_COMMUNITY)
Admission: RE | Admit: 2010-07-05 | Discharge: 2010-07-05 | Disposition: A | Discharge status: Home / Self Care | Payer: Medicare Other | Source: Ambulatory Visit | Attending: Neurosurgery | Admitting: Neurosurgery

## 2010-07-05 DIAGNOSIS — D496 Neoplasm of unspecified behavior of brain: Secondary | ICD-10-CM

## 2010-07-05 DIAGNOSIS — D320 Benign neoplasm of cerebral meninges: Secondary | ICD-10-CM | POA: Insufficient documentation

## 2010-07-05 DIAGNOSIS — J3489 Other specified disorders of nose and nasal sinuses: Secondary | ICD-10-CM | POA: Insufficient documentation

## 2010-07-05 DIAGNOSIS — Z09 Encounter for follow-up examination after completed treatment for conditions other than malignant neoplasm: Secondary | ICD-10-CM | POA: Insufficient documentation

## 2010-07-05 MED ORDER — GADOBENATE DIMEGLUMINE 529 MG/ML IV SOLN
20.0000 mL | Freq: Once | INTRAVENOUS | Status: AC
  Administered 2010-07-05: 20 mL via INTRAVENOUS

## 2010-07-06 ENCOUNTER — Other Ambulatory Visit (HOSPITAL_COMMUNITY): Payer: Medicare Other

## 2011-08-03 ENCOUNTER — Other Ambulatory Visit: Payer: Self-pay | Admitting: Family Medicine

## 2011-08-03 DIAGNOSIS — Z1231 Encounter for screening mammogram for malignant neoplasm of breast: Secondary | ICD-10-CM

## 2011-08-10 ENCOUNTER — Ambulatory Visit
Admission: RE | Admit: 2011-08-10 | Discharge: 2011-08-10 | Disposition: A | Discharge status: Home / Self Care | Payer: Medicare Other | Source: Ambulatory Visit | Attending: Family Medicine | Admitting: Family Medicine

## 2011-08-10 DIAGNOSIS — Z1231 Encounter for screening mammogram for malignant neoplasm of breast: Secondary | ICD-10-CM

## 2012-09-18 ENCOUNTER — Other Ambulatory Visit: Payer: Self-pay

## 2012-09-18 DIAGNOSIS — Z1231 Encounter for screening mammogram for malignant neoplasm of breast: Secondary | ICD-10-CM

## 2012-10-05 ENCOUNTER — Ambulatory Visit: Payer: Medicare Other

## 2012-10-25 ENCOUNTER — Ambulatory Visit
Admission: RE | Admit: 2012-10-25 | Discharge: 2012-10-25 | Disposition: A | Discharge status: Home / Self Care | Payer: Medicare Other | Source: Ambulatory Visit

## 2012-10-25 ENCOUNTER — Ambulatory Visit: Payer: Medicare Other

## 2012-10-25 DIAGNOSIS — Z1231 Encounter for screening mammogram for malignant neoplasm of breast: Secondary | ICD-10-CM

## 2012-11-13 ENCOUNTER — Other Ambulatory Visit: Payer: Self-pay | Admitting: Cardiology

## 2012-11-13 DIAGNOSIS — Z79899 Other long term (current) drug therapy: Secondary | ICD-10-CM

## 2012-11-13 DIAGNOSIS — I4891 Unspecified atrial fibrillation: Secondary | ICD-10-CM

## 2012-12-27 ENCOUNTER — Encounter: Payer: Self-pay | Admitting: *Deleted

## 2012-12-27 ENCOUNTER — Encounter: Payer: Self-pay | Admitting: Cardiology

## 2012-12-27 DIAGNOSIS — I4891 Unspecified atrial fibrillation: Secondary | ICD-10-CM | POA: Insufficient documentation

## 2012-12-27 DIAGNOSIS — M1710 Unilateral primary osteoarthritis, unspecified knee: Secondary | ICD-10-CM | POA: Insufficient documentation

## 2012-12-27 DIAGNOSIS — M4800 Spinal stenosis, site unspecified: Secondary | ICD-10-CM | POA: Insufficient documentation

## 2012-12-27 DIAGNOSIS — J309 Allergic rhinitis, unspecified: Secondary | ICD-10-CM | POA: Insufficient documentation

## 2012-12-27 DIAGNOSIS — N189 Chronic kidney disease, unspecified: Secondary | ICD-10-CM | POA: Insufficient documentation

## 2012-12-27 DIAGNOSIS — I129 Hypertensive chronic kidney disease with stage 1 through stage 4 chronic kidney disease, or unspecified chronic kidney disease: Secondary | ICD-10-CM | POA: Insufficient documentation

## 2012-12-28 ENCOUNTER — Ambulatory Visit (INDEPENDENT_AMBULATORY_CARE_PROVIDER_SITE_OTHER): Payer: Medicare Other | Admitting: Cardiology

## 2012-12-28 ENCOUNTER — Encounter: Payer: Self-pay | Admitting: Cardiology

## 2012-12-28 VITALS — BP 140/89 | HR 80 | Ht 63.0 in | Wt 245.0 lb

## 2012-12-28 DIAGNOSIS — I129 Hypertensive chronic kidney disease with stage 1 through stage 4 chronic kidney disease, or unspecified chronic kidney disease: Secondary | ICD-10-CM

## 2012-12-28 DIAGNOSIS — Z7901 Long term (current) use of anticoagulants: Secondary | ICD-10-CM | POA: Insufficient documentation

## 2012-12-28 DIAGNOSIS — I4891 Unspecified atrial fibrillation: Secondary | ICD-10-CM

## 2012-12-28 NOTE — Progress Notes (Signed)
[address] [place], [place]  [address] Phone: [phone] Fax:  [phone]  Date:  12/28/2012   ID:  Jasmine Hamilton, DOB December 12, 1940, MRN [id number]  PCP:  No primary provider on file.   History of Present Illness: Jasmine Hamilton is a 72 y.o. female with atrial fibrillation, persistent, chronic anticoagulation, rate control, obesity, hypertension, chronic edema, knee pain here for followup. NOAC panel normal. I expressed the importance of this medication in stroke prevention. She's had no bleeding problems. She does have edema of her lower extremities which is likely exacerbated slightly by the amlodipine. I did discuss with her low salt, decreasing her fluid intake. She likes tomatoes, crackers with salt in her salads. She has not had any syncope or dizziness with her underlying right bundle branch block. No chest pain, no shortness of breath  Been out of Xarelto. Cost is an issue. She does tend to eat broccoli, green leafy vegetables however. She has gained some weight. She does feel some shortness of breath with activity.   Wt Readings from Last 3 Encounters:  12/28/12 245 lb (111.131 kg)     Past Medical History  Diagnosis Date  . A-fib     -newly discovered 12/28/09 - rate controlled, Dr Dione Housekeeper  . 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 CKD (chronic kidney disease)   . Allergic rhinitis   . Spinal stenosis   . OA (osteoarthritis) of knee     Past Surgical History  Procedure Laterality Date  . Knee replacement- right knee, dr Despina Hick      Current Outpatient Prescriptions  Medication Sig Dispense Refill  . amLODipine (NORVASC) 5 MG tablet Take 5 mg by mouth daily.      . carvedilol (COREG) 25 MG tablet Take 25 mg by mouth 2 (two) times daily with a meal.      . doxazosin (CARDURA) 2 MG tablet Take 2 mg by mouth daily.      . furosemide (LASIX) 40 MG tablet Take 40  mg by mouth.      . Multiple Vitamin (MULTIVITAMIN) capsule Take 1 capsule by mouth daily.      . traMADol-acetaminophen (ULTRACET) 37.5-325 MG per tablet       . valsartan (DIOVAN) 320 MG tablet Take 320 mg by mouth daily.      Carlena Hurl 20 MG TABS tablet       . ZOSTAVAX [id number] UNT/0.65ML injection        No current facility-administered medications for this visit.    Allergies:   No Known Allergies  Social History:  The patient  reports that she has never smoked. She does not have any smokeless tobacco history on file.   ROS:  Please see the history of present illness.   Denies any syncope, bleeding, orthopnea, PND  PHYSICAL EXAM: VS:  Ht 5\' 3"  (1.6 m)  Wt 245 lb (111.131 kg)  BMI 43.41 kg/m2 Well nourished, well developed, in no acute distress HEENT: normal Neck: no JVD Cardiac:  Irregularly irregular, normal rate; no murmur Lungs:  clear to auscultation bilaterally, no wheezing, rhonchi or rales Abd: soft, nontender, no hepatomegalyObese Ext: no edema Skin: warm and dry Neuro: no focal abnormalities noted  EKG:   Previously atrial fibrillation    ASSESSMENT AND PLAN:  1. Atrial fibrillation-persistent-deserves anticoagulation. We discussed this at length. She has not been taking Xarelto because of cost.  We discussed Coumadin. She does admit that she eats green leafy vegetables/broccoli quite often. I would like for her to discuss her options with pharmacy. 2. Obesity-encourage weight loss. 3. Anticoagulation-as discussed above. 4. Hypertension-continue with current regimen.  Signed, Donato Schultz, MD [facility]  12/28/2012 12:11 PM

## 2012-12-28 NOTE — Patient Instructions (Addendum)
Your physician recommends that you continue on your current medications as directed. Please refer to the Current Medication list given to you today.  Your physician recommends that you return for lab work in: 2 months , Lipid, TSH, BMET  Your physician wants you to follow-up in: 6 months with Dr. Anne Fu. You will receive a reminder letter in the mail two months in advance. If you don't receive a letter, please call our office to schedule the follow-up appointment.

## 2013-01-16 ENCOUNTER — Telehealth: Payer: Self-pay | Admitting: *Deleted

## 2013-01-16 NOTE — Telephone Encounter (Signed)
Patient called requesting eliquis samples and a patient assistance form. Patient aware that these will be left at the front for pick up.

## 2013-01-31 ENCOUNTER — Telehealth: Payer: Self-pay | Admitting: *Deleted

## 2013-01-31 NOTE — Telephone Encounter (Signed)
Provided patient with xarelto samples for 3 weeks. Patient states she delivered her patient care assistance form for help with payment on xarelto, will send note to Dr Anne Fu MA.

## 2013-01-31 NOTE — Telephone Encounter (Signed)
Correction. xarelto samples not eliquis.

## 2013-02-20 ENCOUNTER — Telehealth: Payer: Self-pay | Admitting: Cardiology

## 2013-02-20 NOTE — Telephone Encounter (Signed)
New problem   Pt need to speak to nurse concerning her medication assistance. Please call pt b/c she has ran out of medication.

## 2013-02-21 NOTE — Telephone Encounter (Signed)
Patient has called again for samples of xarelto, will send another note to Phineas Inches to check on status of patient assistance program for xarelto through [employer].

## 2013-02-21 NOTE — Telephone Encounter (Signed)
Please see prior note from December regarding PAP for xarelto through [employer]. Called [employer] today and they do not have this patients application. Will provide patient samples and get her to complete another application for xarelto assistance.

## 2013-02-22 NOTE — Telephone Encounter (Signed)
Signed by Dr. Marlou Porch on 02/22/2013 and placed on Maudie Mercury, RN  Desk to be faxed or mailed.

## 2013-02-27 ENCOUNTER — Ambulatory Visit (INDEPENDENT_AMBULATORY_CARE_PROVIDER_SITE_OTHER): Payer: Medicare Other | Admitting: *Deleted

## 2013-02-27 DIAGNOSIS — I129 Hypertensive chronic kidney disease with stage 1 through stage 4 chronic kidney disease, or unspecified chronic kidney disease: Secondary | ICD-10-CM

## 2013-02-27 DIAGNOSIS — N189 Chronic kidney disease, unspecified: Secondary | ICD-10-CM

## 2013-02-27 DIAGNOSIS — I4891 Unspecified atrial fibrillation: Secondary | ICD-10-CM

## 2013-02-27 LAB — BASIC METABOLIC PANEL
BUN: 12 mg/dL (ref 6–23)
CALCIUM: 9.2 mg/dL (ref 8.4–10.5)
CO2: 27 meq/L (ref 19–32)
CREATININE: 0.7 mg/dL (ref 0.4–1.2)
Chloride: 102 mEq/L (ref 96–112)
GFR: 111.15 mL/min (ref >60.00)
GLUCOSE: 83 mg/dL (ref 70–99)
Potassium: 4 mEq/L (ref 3.5–5.1)
Sodium: 137 mEq/L (ref 135–145)

## 2013-02-27 LAB — LIPID PANEL
CHOL/HDL RATIO: 2
Cholesterol: 173 mg/dL (ref 0–200)
HDL: 93.8 mg/dL (ref >39.00)
LDL Cholesterol: 71 mg/dL (ref 0–99)
Triglycerides: 41 mg/dL (ref 0.0–149.0)
VLDL: 8.2 mg/dL (ref 0.0–40.0)

## 2013-02-27 LAB — TSH: TSH: 0.49 u[IU]/mL (ref 0.35–5.50)

## 2013-03-01 ENCOUNTER — Telehealth: Payer: Self-pay | Admitting: Cardiology

## 2013-03-01 NOTE — Telephone Encounter (Signed)
New message ° ° ° ° ° °Returning a nurses call from yesterday °

## 2013-03-07 NOTE — Telephone Encounter (Signed)
Spoke with patient advised of labs prior result note for labs

## 2013-03-13 ENCOUNTER — Telehealth: Payer: Self-pay | Admitting: Cardiology

## 2013-03-13 NOTE — Telephone Encounter (Signed)
Patient assistance forms

## 2013-03-25 ENCOUNTER — Telehealth: Payer: Self-pay | Admitting: *Deleted

## 2013-03-25 NOTE — Telephone Encounter (Signed)
Patient is out of xarelto, I offered her to come pick up samples. She will come today.

## 2013-04-15 ENCOUNTER — Other Ambulatory Visit: Payer: Self-pay | Admitting: *Deleted

## 2013-04-15 MED ORDER — RIVAROXABAN 20 MG PO TABS: 20.0000 mg | ORAL_TABLET | Freq: Every day | ORAL | Status: DC

## 2013-04-16 ENCOUNTER — Telehealth: Payer: Self-pay | Admitting: *Deleted

## 2013-04-16 NOTE — Telephone Encounter (Signed)
Patient stated that xarelto patient assistance was denied.

## 2013-05-09 ENCOUNTER — Other Ambulatory Visit: Payer: Medicare Other

## 2013-05-10 ENCOUNTER — Other Ambulatory Visit (INDEPENDENT_AMBULATORY_CARE_PROVIDER_SITE_OTHER): Payer: Medicare Other

## 2013-05-10 DIAGNOSIS — I4891 Unspecified atrial fibrillation: Secondary | ICD-10-CM

## 2013-05-10 DIAGNOSIS — Z79899 Other long term (current) drug therapy: Secondary | ICD-10-CM

## 2013-05-10 LAB — CREATININE, SERUM: CREATININE: 0.7 mg/dL (ref 0.4–1.2)

## 2013-05-10 LAB — CBC
HCT: 38.1 % (ref 36.0–46.0)
Hemoglobin: 12.7 g/dL (ref 12.0–15.0)
MCHC: 33.5 g/dL (ref 30.0–36.0)
MCV: 91.6 fl (ref 78.0–100.0)
Platelets: 226 10*3/uL (ref 150.0–400.0)
RBC: 4.16 Mil/uL (ref 3.87–5.11)
RDW: 14 % (ref 11.5–14.6)
WBC: 3.3 10*3/uL — ABNORMAL LOW (ref 4.5–10.5)

## 2013-05-16 ENCOUNTER — Other Ambulatory Visit: Payer: Self-pay | Admitting: Cardiology

## 2013-05-16 MED ORDER — RIVAROXABAN 20 MG PO TABS: 20.0000 mg | ORAL_TABLET | Freq: Every day | ORAL | Status: DC

## 2013-06-27 ENCOUNTER — Ambulatory Visit: Payer: Medicare Other | Admitting: Cardiology

## 2013-07-15 ENCOUNTER — Encounter: Payer: Self-pay | Admitting: Cardiology

## 2013-07-15 ENCOUNTER — Ambulatory Visit (INDEPENDENT_AMBULATORY_CARE_PROVIDER_SITE_OTHER): Payer: Medicare Other | Admitting: Cardiology

## 2013-07-15 VITALS — BP 181/105 | HR 86 | Ht 63.0 in | Wt 237.1 lb

## 2013-07-15 DIAGNOSIS — Z7901 Long term (current) use of anticoagulants: Secondary | ICD-10-CM

## 2013-07-15 DIAGNOSIS — I4891 Unspecified atrial fibrillation: Secondary | ICD-10-CM

## 2013-07-15 NOTE — Patient Instructions (Signed)
Your physician recommends that you schedule a follow-up appointment in: 3 months with a Physician Extender  Your Physician recommends you decrease your salt intake   Your physician recommends that you continue on your current medications as directed. Please refer to the Current Medication list given to you today.

## 2013-07-15 NOTE — Progress Notes (Signed)
[place]. [address] [place], [place]  [address] Phone: [phone] Fax:  [phone]  Date:  07/15/2013   ID:  Jasmine Hamilton, DOB 20-Jul-1940, MRN [id number]  PCP:  No primary provider on file.   History of Present Illness: Jasmine Hamilton is a 73 y.o. female with atrial fibrillation, persistent, chronic anticoagulation, rate control, obesity, hypertension, chronic edema, knee pain here for followup. NOAC panel normal. I expressed the importance of this medication in stroke prevention. She's had no bleeding problems. She does have edema of her lower extremities which is likely exacerbated slightly by the amlodipine. I have discussed with her low salt diet, decreasing her fluid intake. She likes tomatoes, crackers with salt in her salads. She has not had any syncope or dizziness with her underlying right bundle branch block. No chest pain, no shortness of breath  Been out of Xarelto off and on. Cost is an issue. She does tend to eat broccoli, green leafy vegetables however. She has gained some weight. She does feel some shortness of breath with activity chronically.  BP is up today. She admits that she has not been taking her furosemide. She also admits to dietary indiscretion such as hot dogs, salt, chips.   Wt Readings from Last 3 Encounters:  07/15/13 237 lb 1.9 oz (107.557 kg)  12/28/12 245 lb (111.131 kg)     Past Medical History  Diagnosis Date  . A-fib     -newly discovered 12/28/09 - rate controlled, Dr Etter Sjogren  . 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 CKD (chronic kidney disease)   . Allergic rhinitis   . Spinal stenosis   . OA (osteoarthritis) of knee     Past Surgical History  Procedure Laterality Date  . Knee replacement- right knee, dr Maureen Ralphs      Current Outpatient Prescriptions  Medication Sig Dispense Refill  . amLODipine (NORVASC) 5 MG tablet Take 5 mg by mouth daily.       . carvedilol (COREG) 25 MG tablet Take 25 mg by mouth 2 (two) times daily with a meal.      . doxazosin (CARDURA) 2 MG tablet Take 2 mg by mouth daily.      . furosemide (LASIX) 40 MG tablet Take 40 mg by mouth.      . Multiple Vitamin (MULTIVITAMIN) capsule Take 1 capsule by mouth daily.      . potassium chloride SA (K-DUR,KLOR-CON) 20 MEQ tablet Take 1 tablet by mouth daily.      . Rivaroxaban (XARELTO) 20 MG TABS tablet Take 1 tablet (20 mg total) by mouth daily with supper.  30 tablet  3  . traMADol-acetaminophen (ULTRACET) 37.5-325 MG per tablet every 4 (four) hours as needed.       . valsartan (DIOVAN) 320 MG tablet Take 320 mg by mouth daily.       No current facility-administered medications for this visit.    Allergies:   No Known Allergies  Social History:  The patient  reports that she has never smoked. She does not have any smokeless tobacco history on file.   ROS:  Please see the history of present illness.   Denies any syncope, bleeding, orthopnea, PND  PHYSICAL EXAM: VS:  BP 181/105  Pulse 86  Ht 5\' 3"  (1.6 m)  Wt 237 lb 1.9 oz (107.557 kg)  BMI 42.01 kg/m2148/98 on repeat. Well nourished, well developed, in  no acute distress HEENT: normal Neck: no JVD Cardiac:  Irregularly irregular, normal rate; no murmur Lungs:  clear to auscultation bilaterally, no wheezing, rhonchi or rales Abd: soft, nontender, no hepatomegalyObese Ext: no edema Skin: warm and dry Neuro: no focal abnormalities noted  EKG:   Previously atrial fibrillation    ASSESSMENT AND PLAN:  1. Atrial fibrillation-persistent-deserves anticoagulation. We discussed this at length. She has been off and on Xarelto because of cost. We have discussed Coumadin in the past. She does admit that she eats green leafy vegetables/broccoli quite often. I would like for her to discuss her options with pharmacy. 2. Obesity-encourage weight loss. 3. Anticoagulation-as discussed above. 4. Hypertension-highly  elevated today. Reports dietary indiscretion. I do think that if she were to take her Lasix on a daily basis to help with her over intravascular volume, this would help out significantly with her blood pressure.  Signed, Candee Furbish, MD [facility]  07/15/2013 4:53 PM

## 2013-09-09 ENCOUNTER — Other Ambulatory Visit: Payer: Self-pay | Admitting: Cardiology

## 2013-10-23 ENCOUNTER — Encounter: Payer: Self-pay | Admitting: Cardiology

## 2013-10-23 ENCOUNTER — Ambulatory Visit (INDEPENDENT_AMBULATORY_CARE_PROVIDER_SITE_OTHER): Payer: Medicare Other | Admitting: Cardiology

## 2013-10-23 VITALS — BP 140/80 | HR 83 | Ht 63.5 in | Wt 236.0 lb

## 2013-10-23 DIAGNOSIS — I4891 Unspecified atrial fibrillation: Secondary | ICD-10-CM

## 2013-10-23 DIAGNOSIS — Z7901 Long term (current) use of anticoagulants: Secondary | ICD-10-CM

## 2013-10-23 DIAGNOSIS — E669 Obesity, unspecified: Secondary | ICD-10-CM

## 2013-10-23 DIAGNOSIS — I10 Essential (primary) hypertension: Secondary | ICD-10-CM

## 2013-10-23 DIAGNOSIS — I4819 Other persistent atrial fibrillation: Secondary | ICD-10-CM

## 2013-10-23 NOTE — Patient Instructions (Signed)
The current medical regimen is effective;  continue present plan and medications.  Follow up in 6 months with Dr. Skains.  You will receive a letter in the mail 2 months before you are due.  Please call us when you receive this letter to schedule your follow up appointment.  
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
## 2013-10-29 ENCOUNTER — Other Ambulatory Visit: Payer: Self-pay

## 2013-10-29 DIAGNOSIS — Z1231 Encounter for screening mammogram for malignant neoplasm of breast: Secondary | ICD-10-CM

## 2013-11-11 ENCOUNTER — Other Ambulatory Visit: Payer: Self-pay | Admitting: Cardiology

## 2013-11-13 ENCOUNTER — Ambulatory Visit: Payer: Medicare Other

## 2013-11-27 ENCOUNTER — Other Ambulatory Visit: Payer: Self-pay | Admitting: Family Medicine

## 2013-11-27 DIAGNOSIS — M542 Cervicalgia: Secondary | ICD-10-CM

## 2013-11-29 ENCOUNTER — Ambulatory Visit: Payer: Medicare Other

## 2013-12-06 ENCOUNTER — Ambulatory Visit
Admission: RE | Admit: 2013-12-06 | Discharge: 2013-12-06 | Disposition: A | Discharge status: Home / Self Care | Payer: Medicare Other | Source: Ambulatory Visit | Attending: Family Medicine | Admitting: Family Medicine

## 2013-12-06 DIAGNOSIS — M542 Cervicalgia: Secondary | ICD-10-CM

## 2014-01-14 ENCOUNTER — Other Ambulatory Visit: Payer: Self-pay | Admitting: Neurosurgery

## 2014-01-14 DIAGNOSIS — D329 Benign neoplasm of meninges, unspecified: Secondary | ICD-10-CM

## 2014-02-17 ENCOUNTER — Ambulatory Visit
Admission: RE | Admit: 2014-02-17 | Discharge: 2014-02-17 | Disposition: A | Discharge status: Home / Self Care | Payer: Medicare Other | Source: Ambulatory Visit | Attending: Neurosurgery | Admitting: Neurosurgery

## 2014-02-17 DIAGNOSIS — D329 Benign neoplasm of meninges, unspecified: Secondary | ICD-10-CM

## 2014-02-17 MED ORDER — GADOBENATE DIMEGLUMINE 529 MG/ML IV SOLN
20.0000 mL | Freq: Once | INTRAVENOUS | Status: AC | PRN
  Administered 2014-02-17: 20 mL via INTRAVENOUS

## 2014-03-10 ENCOUNTER — Encounter: Payer: Self-pay | Admitting: Neurology

## 2014-03-10 ENCOUNTER — Ambulatory Visit (INDEPENDENT_AMBULATORY_CARE_PROVIDER_SITE_OTHER): Payer: Medicare Other | Admitting: Neurology

## 2014-03-10 VITALS — BP 122/76 | HR 90 | Resp 16 | Wt 229.0 lb

## 2014-03-10 DIAGNOSIS — R29898 Other symptoms and signs involving the musculoskeletal system: Secondary | ICD-10-CM

## 2014-03-10 DIAGNOSIS — M79602 Pain in left arm: Secondary | ICD-10-CM

## 2014-03-10 DIAGNOSIS — R200 Anesthesia of skin: Secondary | ICD-10-CM

## 2014-03-10 LAB — VITAMIN B12: Vitamin B-12: 736 pg/mL (ref 211–911)

## 2014-03-10 LAB — CK: CK TOTAL: 160 U/L (ref 7–177)

## 2014-03-10 NOTE — Patient Instructions (Signed)
I'm not sure why you are weak at this time. 1.  First we will check blood work 2.  If unremarkable, will check another nerve conduction study. 3.  We may need to get MRI of the mid and lower back as well. 4.  I don't think the arm pain is nerve related.  You may want to see an orthopedist.

## 2014-03-10 NOTE — Progress Notes (Signed)
NEUROLOGY CONSULTATION NOTE  LUN MURO MRN: [id number] DOB: 09-08-1940  Referring provider: Dr. Vertell Limber Primary care provider: Dr. Dema Severin  Reason for consult:  Weakness in the legs and numbness.  HISTORY OF PRESENT ILLNESS: Jasmine Hamilton is a 74 year old right-handed woman with persistent atrial fibrillation, chronic anticoagulation, morbid obesity, hypertension, chronic edema, CKD, spinal stenosis, and osteoarthritis of knee and right knee replacement who presents for leg weakness.  Records, labs and MRIs reviewed.  She reports that symptoms began after receiving the flu shot in October.  She noted pain in the left shoulder and upper arm.  This later progressed to numbness and tingling involving all the fingers in the left hand.  Suspecting carpal tunnel syndrome, she was advised to wear a wrist splint.  When symptoms got worse, she was referred to neurosurgery.  An MRI of the cervical spine as performed on 12/07/13, which showed spondylosis and degenerative disc disease causing prominent impingement at C5-6 and C7-T1, and moderate impingement at C2-3, C3-4, C4-5, C6-7 and T1-2.  Equivocal edema or myelomalacia in the cord is seen at the C4-5 level.  Moderate central canal narrowing with disc bulge noted.  She had a NCV-EMG of the upper extremities performed on 01/03/14, which was unremarkable except for subtle findings suggesting bilateral C7 and C8 radiculopathy.  She was referred to PT at the end of November.  White in PT, she began to have gradual weakness of the legs, to the point that she was unable to walk without assistance.  She also developed numbness in her feet as well.  She denies back pain.  She previously had pain in the legs which has since resolved.  She denies any changes in bladder or bowel function.  She has a history of a skull-based meningioma.  Her most recent MRI of the brain with and without contrast was performed on 02/17/14, which revealed 10 x 10 x 6 mm  meningioma arising left of midline at the tuberculum sella, stable compared to prior study from 2012.  However, there is white matter signal change in the pons.  The IV was given in her right arm.  Since the MRI, she now reports numbness in the right hand as well.  Labs from October and November include cholesterol 194, TG 60, LDL 90, TSH 0.45, HGB 13.4, HCT 41.3, PLT 242  PAST MEDICAL HISTORY: Past Medical History  Diagnosis Date  . A-fib     -newly discovered 12/28/09 - rate controlled, Dr Etter Sjogren  . 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 CKD (chronic kidney disease)   . Allergic rhinitis   . Spinal stenosis   . OA (osteoarthritis) of knee     PAST SURGICAL HISTORY: Past Surgical History  Procedure Laterality Date  . Knee replacement- right knee, dr Maureen Ralphs      MEDICATIONS: Current Outpatient Prescriptions on File Prior to Visit  Medication Sig Dispense Refill  . amLODipine (NORVASC) 5 MG tablet Take 5 mg by mouth daily.    . carvedilol (COREG) 25 MG tablet Take 25 mg by mouth 2 (two) times daily with a meal.    . doxazosin (CARDURA) 2 MG tablet Take 2 mg by mouth daily.    . furosemide (LASIX) 40 MG tablet Take 40 mg by mouth.    . Multiple Vitamin (MULTIVITAMIN) capsule Take 1 capsule by mouth daily.    . potassium chloride SA (K-DUR,KLOR-CON) 20 MEQ tablet Take 1  tablet by mouth daily.    . traMADol-acetaminophen (ULTRACET) 37.5-325 MG per tablet every 4 (four) hours as needed.     . valsartan (DIOVAN) 320 MG tablet Take 320 mg by mouth daily.    Alveda Reasons 20 MG TABS tablet take 1 tablet by mouth once daily WITH SUPPER 30 tablet 5   No current facility-administered medications on file prior to visit.    ALLERGIES: Allergies  Allergen Reactions  . Codeine Swelling  . Lotensin [Benazepril Hcl] Cough  . Verelan [Verapamil Hcl Er] Swelling    FAMILY HISTORY: Family History  Problem Relation Age of  Onset  . Hypertension Mother   . Diabetes Mother   . Diabetes Brother   . Heart attack Brother   . Kidney disease Sister   . Stroke Mother   . Fainting Father     SOCIAL HISTORY: History   Social History  . Marital Status: Widowed    Spouse Name: N/A    Number of Children: N/A  . Years of Education: N/A   Occupational History  . Not on file.   Social History Main Topics  . Smoking status: Never Smoker   . Smokeless tobacco: Not on file  . Alcohol Use: No  . Drug Use: No  . Sexual Activity: Not on file   Other Topics Concern  . Not on file   Social History Narrative    REVIEW OF SYSTEMS: Constitutional: No fevers, chills, or sweats, no generalized fatigue, change in appetite Eyes: No visual changes, double vision, eye pain Ear, nose and throat: No hearing loss, ear pain, nasal congestion, sore throat Cardiovascular: No chest pain, palpitations Respiratory:  No shortness of breath at rest or with exertion, wheezes GastrointestinaI: No nausea, vomiting, diarrhea, abdominal pain, fecal incontinence Genitourinary:  No dysuria, urinary retention or frequency Musculoskeletal:  No neck pain, back pain Integumentary: Discoloration and swelling in ankles. Neurological: as above Psychiatric: No depression, insomnia, anxiety Endocrine: No palpitations, fatigue, diaphoresis, mood swings, change in appetite, change in weight, increased thirst Hematologic/Lymphatic:  No anemia, purpura, petechiae. Allergic/Immunologic: no itchy/runny eyes, nasal congestion, recent allergic reactions, rashes  PHYSICAL EXAM: Filed Vitals:   03/10/14 1517  BP: 122/76  Pulse: 90  Resp: 16   General: No acute distress Head:  Normocephalic/atraumatic Eyes:  fundi unremarkable, without vessel changes, exudates, hemorrhages or papilledema. Neck: supple, no paraspinal tenderness, full range of motion Back: No paraspinal tenderness Heart: regular rate and rhythm Lungs: Clear to auscultation  bilaterally. Vascular: No carotid bruits. Neurological Exam: Mental status: alert and oriented to person, place, and time, recent and remote memory intact, fund of knowledge intact, attention and concentration intact, speech fluent and not dysarthric, language intact. Cranial nerves: CN I: not tested CN II: pupils equal, round and reactive to light, visual fields intact, fundi unremarkable, without vessel changes, exudates, hemorrhages or papilledema. CN III, IV, VI:  full range of motion, no nystagmus, no ptosis CN V: facial sensation intact CN VII: upper and lower face symmetric CN VIII: hearing intact CN IX, X: gag intact, uvula midline CN XI: sternocleidomastoid and trapezius muscles intact CN XII: tongue midline Bulk & Tone: normal, no fasciculations. Motor:  Unable to assess left deltoid due to pain.  5- right interossei, right hamstrings, left wrist flexion and left quadriceps.  4+ left tricepts, left grip, left hamstrings and right wrist extension.  4- left interossei.  3+ left wrist extension and both hip flexion.  Otherwise 5/5. Sensation:  Reduced vibration in the feet, worse  on the right.  Pinprick intact. Deep Tendon Reflexes:  1+ and symmetric in upper extremities, absent in lower extremities.  Right Babinski. Finger to nose testing:  No dysmetria. Cannot fully extend left arm. Heel to shin:  Unable to assess. Gait:  Needs assistance standing up.  Cannot take steps.  IMPRESSION: Progressive proximal lower extremity weakness and distal upper extremity weakness with numbness and tingling.  Unclear etiology.  CIDP should be considered.  Hand weakness may be related to cervical radiculopathy, as demonstrated on MRI. She does have a right Babinski, but this could be chronic and related to findings on cervical MRI Left proximal arm pain.  Does not seem neurological.  PLAN: 1.  First, we will check CK, aldolase, and B12. 2.  If unremarkable, would repeat NCV-EMG including the  lower extremities to look for any evidence of demyelinating disease such as CIDP or lumbar radiculopathy.  Given her girth and swelling in the legs, results may be compromised. 3.  Consider MRI of the thoracic and lumbar spine. 4.  The left proximal arm pain does not seem neurological.  Consider orthopedic evaluation. 5.  Continue PT.  Thank you for allowing me to take part in the care of this patient.  Metta Clines, DO  CC:  Harlan Stains, MD  Erline Levine, MD

## 2014-03-12 ENCOUNTER — Other Ambulatory Visit: Payer: Self-pay | Admitting: *Deleted

## 2014-03-12 ENCOUNTER — Telehealth: Payer: Self-pay | Admitting: *Deleted

## 2014-03-12 ENCOUNTER — Telehealth: Payer: Self-pay | Admitting: Neurology

## 2014-03-12 DIAGNOSIS — M4804 Spinal stenosis, thoracic region: Secondary | ICD-10-CM

## 2014-03-12 DIAGNOSIS — M4800 Spinal stenosis, site unspecified: Secondary | ICD-10-CM

## 2014-03-12 NOTE — Telephone Encounter (Signed)
Patient states she would like to move forward with the MRI T spine and L spine  . Do you want  With and without  Or without contrast ? Please advise

## 2014-03-12 NOTE — Telephone Encounter (Signed)
Pt has not heard anything about a MRI and wants to know the status please call [phone]

## 2014-03-12 NOTE — Telephone Encounter (Signed)
Without contrast is fine.

## 2014-03-12 NOTE — Telephone Encounter (Signed)
Patient  is aware that she has appt ot [facility] on 03/27/14 3:45 pm for MRI Lumbar and MRI T spine

## 2014-03-13 LAB — ALDOLASE: Aldolase: 4.7 U/L (ref <=8.1)

## 2014-03-27 ENCOUNTER — Ambulatory Visit (HOSPITAL_COMMUNITY)
Admission: RE | Admit: 2014-03-27 | Discharge: 2014-03-27 | Disposition: A | Discharge status: Home / Self Care | Payer: Medicare Other | Source: Ambulatory Visit | Attending: Neurology | Admitting: Neurology

## 2014-03-27 DIAGNOSIS — M4800 Spinal stenosis, site unspecified: Secondary | ICD-10-CM

## 2014-03-27 DIAGNOSIS — M4804 Spinal stenosis, thoracic region: Secondary | ICD-10-CM | POA: Diagnosis not present

## 2014-04-01 ENCOUNTER — Telehealth: Payer: Self-pay | Admitting: *Deleted

## 2014-04-01 ENCOUNTER — Other Ambulatory Visit: Payer: Self-pay | Admitting: *Deleted

## 2014-04-01 DIAGNOSIS — M4800 Spinal stenosis, site unspecified: Secondary | ICD-10-CM

## 2014-04-01 NOTE — Telephone Encounter (Signed)
Patient would like to know if her most recent MRI has been reviewed she states that she is not feeling any better and is very concerned. Please advise

## 2014-04-01 NOTE — Telephone Encounter (Signed)
I called patient now answer  .

## 2014-04-01 NOTE — Telephone Encounter (Signed)
MRI does show significant stenosis in the lumbar region which may be a cause for leg weakness. I would like to see what the EMG shows. Otherwise, an evaluation by neurosurgery would be warranted. Patient is aware  And EMG order was entered

## 2014-04-01 NOTE — Telephone Encounter (Signed)
-----   Message from Dudley Major, DO sent at 04/01/2014  3:20 PM EST ----- MRI does show significant stenosis in the lumbar region which may be a cause for leg weakness.  I would like to see what the EMG shows.  Otherwise, an evaluation by neurosurgery would be warranted. ----- Message -----    From: Rad Results In Interface    Sent: 03/28/2014   7:52 AM      To: Dudley Major, DO

## 2014-04-10 ENCOUNTER — Ambulatory Visit (INDEPENDENT_AMBULATORY_CARE_PROVIDER_SITE_OTHER): Payer: Medicare Other | Admitting: Neurology

## 2014-04-10 DIAGNOSIS — M79602 Pain in left arm: Secondary | ICD-10-CM

## 2014-04-10 DIAGNOSIS — M5417 Radiculopathy, lumbosacral region: Secondary | ICD-10-CM

## 2014-04-10 DIAGNOSIS — R200 Anesthesia of skin: Secondary | ICD-10-CM

## 2014-04-10 DIAGNOSIS — R29898 Other symptoms and signs involving the musculoskeletal system: Secondary | ICD-10-CM

## 2014-04-10 NOTE — Procedures (Signed)
[facility]  [address], [address]  [place], [place] [address] Tel: [phone] Fax:  [phone] Test Date:  04/10/2014  Patient: Jasmine Hamilton DOB: 07-25-1940 Physician: Narda Amber, DO  Sex: Female Height: 5' 3.5" Ref Phys: Metta Clines  ID#: [id number] Temp: 33.0C Technician: Laureen Ochs R. NCS T.   Patient Complaints: Patient is a 74 year old female here for evaluation of bilateral leg and foot numbness, tingling and pain worse on the left.  NCV & EMG Findings: Extensive electrodiagnostic testing of the left lower extremity and additional studies of the right is somewhat hampered by patient's body physiognomy and needle electrode examination was limited as patient is on anticoagulation. Findings are as follows: 1. Bilateral sural and superficial peroneal sensory responses are absent. 2. Bilateral peroneal motor responses at the extensor digitorum brevis and tibial motor responses are markedly reduced and proximal stimulation at the knee was unobtainable, which is technical in nature. The left peroneal motor response at the tibialis anterior is asymmetrically reduced when compared to the right. 3. Chronic motor axon loss changes are seen affecting all the tested muscles of the lower extremity, and worse on the left side. There is no accompanied active denervation seen.   Impression: 1. The electrophysiologic findings are most consistent with a severe generalized sensorimotor polyneuropathy, axon loss in type, affecting the lower extremities. 2. Chronic L3-S1 radiculopathy affecting the lower extremities bilaterally; these findings are moderately severe in degree electrically and worse on the left. 3. There is no evidence of a generalized myopathy affecting the lower extremities.    ___________________________ Narda Amber, DO    Nerve Conduction Studies Anti Sensory Summary Table   Site NR Peak (ms) Norm Peak (ms) P-T Amp (V) Norm P-T Amp  Left Sup Peroneal  Anti Sensory (Ant Lat Mall)  12 cm NR  <4.6  >3  Right Sup Peroneal Anti Sensory (Ant Lat Mall)  33C  12 cm NR  <4.6  >3  Left Sural Anti Sensory (Lat Mall)  33C  Calf NR  <4.6  >3  Right Sural Anti Sensory (Lat Mall)  33C  Calf NR  <4.6  >3   Motor Summary Table   Site NR Onset (ms) Norm Onset (ms) O-P Amp (mV) Norm O-P Amp Site1 Site2 Delta-0 (ms) Dist (cm) Vel (m/s) Norm Vel (m/s)  Left Peroneal Motor (Ext Dig Brev)    body habitus behind knee  Ankle    3.8 <6.0 1.2 >2.5 B Fib Ankle  0.0  >40  B Fib NR     Poplt B Fib  0.0  >40  Poplt NR            Right Peroneal Motor (Ext Dig Brev)  33C    body habitus behind knee  Ankle    4.9 <6.0 1.4 >2.5 B Fib Ankle 7.1 32.5 46 >40  B Fib    12.0  1.1  Poplt B Fib 2.1 6.0 29 >40  Poplt    14.1  0.3         Left Peroneal TA Motor (Tib Ant)  33C  Fib Head    2.7 <4.5 0.9 >3 Poplit Fib Head 1.6 9.0 56 >40  Poplit    4.3  0.6         Right Peroneal TA Motor (Tib Ant)  33C    body habitus behind knee  Fib Head    3.5 <4.5 2.0 >3 Poplit Fib Head 1.0 8.0 80 >40  Poplit  4.5  0.4         Left Tibial Motor (Abd Hall Brev)    body habitus behind knee  Ankle    3.1 <6.0 2.1 >4 Knee Ankle  0.0  >40  Knee NR            Right Tibial Motor (Abd Hall Brev)  33C    body habitus behind knee  Ankle    6.0 <6.0 1.0 >4 Knee Ankle 5.6 39.0 70 >40  Knee    11.6  0.1          EMG   Side Muscle Ins Act Fibs Psw Fasc Number Recrt Dur Dur. Amp Amp. Poly Poly. Comment  Right AntTibialis Nml Nml Nml Nml 2- Mod-R Many 1+ Some 1+ Many Nml N/A  Right Gastroc Nml Nml Nml Nml 1- Mod-R Few 1+ Nml Nml Nml Nml N/A  Left Flex Dig Long Nml Nml Nml Nml 2- Rapid Some 1+ Nml Nml Nml Nml N/A  Left RectFemoris Nml Nml Nml Nml 2- Rapid Some 1+ Nml Nml Nml Nml N/A  Left AntTibialis Nml Nml Nml Nml 3- Rapid Some 1+ Some 1+ Nml Nml N/A  Left Gastroc Nml Nml Nml Nml 2- Mod-R Some 1+ Nml Nml Nml Nml N/A  Right RectFemoris Nml Nml Nml Nml 2- Mod-R Some 1+ Some 1+ Nml Nml  N/A  Right Flex Dig Long Nml Nml Nml Nml 2- Mod-R Some 1+ Nml Nml Nml Nml N/A      Waveforms:

## 2014-04-11 ENCOUNTER — Telehealth: Payer: Self-pay | Admitting: Neurology

## 2014-04-14 NOTE — Telephone Encounter (Signed)
error 

## 2014-04-15 ENCOUNTER — Telehealth: Payer: Self-pay | Admitting: Neurology

## 2014-04-15 NOTE — Telephone Encounter (Signed)
Patient is aware of test results and was advised to make a follow up appt with Dr Vertell Limber

## 2014-04-15 NOTE — Telephone Encounter (Signed)
Pt called wanting to f/u on on her test results from last Thursday. C/b [phone]

## 2014-04-22 ENCOUNTER — Telehealth: Payer: Self-pay | Admitting: Neurology

## 2014-04-22 NOTE — Telephone Encounter (Signed)
Patient is aware of EMG results

## 2014-04-22 NOTE — Telephone Encounter (Signed)
Pt left message on the voice mail and wants to know the test results for the EMG that was done please call patient at [phone]

## 2014-05-26 ENCOUNTER — Other Ambulatory Visit: Payer: Self-pay | Admitting: Cardiology

## 2014-05-28 ENCOUNTER — Encounter: Payer: Self-pay | Admitting: Cardiology

## 2014-05-28 ENCOUNTER — Ambulatory Visit (INDEPENDENT_AMBULATORY_CARE_PROVIDER_SITE_OTHER): Payer: Medicare Other | Admitting: Cardiology

## 2014-05-28 VITALS — BP 132/74 | HR 94 | Ht 63.0 in | Wt 223.0 lb

## 2014-05-28 DIAGNOSIS — Z7901 Long term (current) use of anticoagulants: Secondary | ICD-10-CM

## 2014-05-28 DIAGNOSIS — I481 Persistent atrial fibrillation: Secondary | ICD-10-CM

## 2014-05-28 DIAGNOSIS — I10 Essential (primary) hypertension: Secondary | ICD-10-CM | POA: Diagnosis not present

## 2014-05-28 DIAGNOSIS — I4819 Other persistent atrial fibrillation: Secondary | ICD-10-CM
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
## 2014-05-28 NOTE — Patient Instructions (Signed)
**Note De-identified Jasmine Hamilton Obfuscation** Your physician recommends that you continue on your current medications as directed. Please refer to the Current Medication list given to you today.  Your physician wants you to follow-up in: 6 months. You will receive a reminder letter in the mail two months in advance. If you don't receive a letter, please call our office to schedule the follow-up appointment.  

## 2014-05-30 ENCOUNTER — Other Ambulatory Visit: Payer: Self-pay | Admitting: Neurosurgery

## 2014-05-30 DIAGNOSIS — G959 Disease of spinal cord, unspecified: Secondary | ICD-10-CM

## 2014-05-31 ENCOUNTER — Ambulatory Visit
Admission: RE | Admit: 2014-05-31 | Discharge: 2014-05-31 | Disposition: A | Discharge status: Home / Self Care | Payer: Medicare Other | Source: Ambulatory Visit | Attending: Neurosurgery | Admitting: Neurosurgery

## 2014-05-31 DIAGNOSIS — G959 Disease of spinal cord, unspecified: Secondary | ICD-10-CM

## 2014-06-02 ENCOUNTER — Other Ambulatory Visit: Payer: Self-pay | Admitting: Neurosurgery

## 2014-06-06 ENCOUNTER — Encounter (HOSPITAL_COMMUNITY): Payer: Self-pay | Admitting: *Deleted

## 2014-06-06 NOTE — Progress Notes (Signed)
Pt denies chest pain and is under the care of Dr. Marlou Porch, cardiology. Pt stated that she was instructed by Dr. Melven Sartorius nurse to stop taking Xarelto 5 days prior to procedure. Pt stated that her last was Wednesday, April 20. Pt made aware to stop taking Aspirin, vitamins and herbal medications. Do not take any NSAIDs ie: Ibuprofen, Advil, Naproxen or any medication containing Aspirin;stop now. Pt and Tonya, pt family member ( on speaker phone)  verbalized understanding of all pre-op instructions.

## 2014-06-06 NOTE — Progress Notes (Signed)
Anesthesia Chart Review: SAME DAY WORK-UP.  Patient is a 74 year old female scheduled for C3-4, C5-6, C6-7 ACDF on 06/09/14 by Dr. Vertell Limber. DX: Cervical cord compression.  History includes non-smoker, chronic afib (diagnosed '11), HTN, chronic edema, obesity (current BMI 39), CKD, osteoarthritis, anemia, hypothyroidism, right TKA '01. For anesthesia history, she reported mother was in a coma after surgery/anesthesia (no history of MH). OSA screening score is 5. PCP is Dr. Harlan Stains. Cardiologist is Dr. Marlou Porch, last visit 05/28/14.  Meds include amlodipine, Coreg, Lasix, KCl, Diovan, Xarelto (on hold for surgery).  12/28/09 Echo: Normal LV size and function, no regional wall motion abnormalities, LVEF 55-60%, mild RV dilation, moderate LAE, mild RAE, mild MR/TR, mildly elevated estimated RVSP, small pericardial effusion with no evidence of hemodynamic compromise.  07/15/13 EKG: afib, RBBB.  She is for labs on the day of surgery.  She has had recent cardiology evaluation.  No new testing ordered. She is a same day work-up, so further evaluation by her anesthesiologist on the day of surgery to ensure no acute changes.  If none, then I would anticipate that she could proceed as planned.  George Hugh [facility] Short Stay Center/Anesthesiology Phone [phone] 06/06/2014 1:21 PM

## 2014-06-06 NOTE — Progress Notes (Signed)
   06/06/14 1134  OBSTRUCTIVE SLEEP APNEA  Have you ever been diagnosed with sleep apnea through a sleep study? No  Do you snore loudly (loud enough to be heard through closed doors)?  1  Do you often feel tired, fatigued, or sleepy during the daytime? 1  Has anyone observed you stop breathing during your sleep? 0  Do you have, or are you being treated for high blood pressure? 1  BMI more than 35 kg/m2? 1  Age over 74 years old? 1  Gender: 0

## 2014-06-07 NOTE — Discharge Instructions (Signed)

## 2014-06-08 MED ORDER — CEFAZOLIN SODIUM-DEXTROSE 2-3 GM-% IV SOLR
2.0000 g | INTRAVENOUS | Status: AC
  Administered 2014-06-09: 2 g via INTRAVENOUS

## 2014-06-09 ENCOUNTER — Inpatient Hospital Stay (HOSPITAL_COMMUNITY): Payer: Medicare Other

## 2014-06-09 ENCOUNTER — Inpatient Hospital Stay (HOSPITAL_COMMUNITY): Payer: Medicare Other | Admitting: Vascular Surgery

## 2014-06-09 ENCOUNTER — Inpatient Hospital Stay (HOSPITAL_COMMUNITY)
Admission: RE | Admit: 2014-06-09 | Discharge: 2014-06-12 | DRG: 472 | Disposition: A | Discharge status: Rehabilitation Facility | Payer: Medicare Other | Source: Ambulatory Visit | Attending: Neurosurgery | Admitting: Neurosurgery

## 2014-06-09 ENCOUNTER — Encounter (HOSPITAL_COMMUNITY)
Admission: RE | Disposition: A | Discharge status: Rehabilitation Facility | Payer: Self-pay | Source: Ambulatory Visit | Attending: Neurosurgery

## 2014-06-09 ENCOUNTER — Encounter (HOSPITAL_COMMUNITY): Payer: Self-pay | Admitting: *Deleted

## 2014-06-09 DIAGNOSIS — Z981 Arthrodesis status: Secondary | ICD-10-CM

## 2014-06-09 DIAGNOSIS — Z7901 Long term (current) use of anticoagulants: Secondary | ICD-10-CM

## 2014-06-09 DIAGNOSIS — Z96651 Presence of right artificial knee joint: Secondary | ICD-10-CM | POA: Diagnosis present

## 2014-06-09 DIAGNOSIS — M79603 Pain in arm, unspecified: Secondary | ICD-10-CM | POA: Diagnosis present

## 2014-06-09 DIAGNOSIS — Z79899 Other long term (current) drug therapy: Secondary | ICD-10-CM

## 2014-06-09 DIAGNOSIS — Z6839 Body mass index (BMI) 39.0-39.9, adult: Secondary | ICD-10-CM

## 2014-06-09 DIAGNOSIS — Z888 Allergy status to other drugs, medicaments and biological substances status: Secondary | ICD-10-CM | POA: Diagnosis not present

## 2014-06-09 DIAGNOSIS — M179 Osteoarthritis of knee, unspecified: Secondary | ICD-10-CM | POA: Diagnosis present

## 2014-06-09 DIAGNOSIS — G992 Myelopathy in diseases classified elsewhere: Secondary | ICD-10-CM | POA: Diagnosis present

## 2014-06-09 DIAGNOSIS — M4802 Spinal stenosis, cervical region: Principal | ICD-10-CM | POA: Diagnosis present

## 2014-06-09 DIAGNOSIS — J309 Allergic rhinitis, unspecified: Secondary | ICD-10-CM | POA: Diagnosis present

## 2014-06-09 DIAGNOSIS — M4712 Other spondylosis with myelopathy, cervical region: Secondary | ICD-10-CM | POA: Diagnosis present

## 2014-06-09 DIAGNOSIS — Z885 Allergy status to narcotic agent status: Secondary | ICD-10-CM

## 2014-06-09 DIAGNOSIS — N181 Chronic kidney disease, stage 1: Secondary | ICD-10-CM | POA: Diagnosis present

## 2014-06-09 DIAGNOSIS — G8250 Quadriplegia, unspecified: Secondary | ICD-10-CM | POA: Diagnosis not present

## 2014-06-09 DIAGNOSIS — E039 Hypothyroidism, unspecified: Secondary | ICD-10-CM | POA: Diagnosis present

## 2014-06-09 DIAGNOSIS — G8222 Paraplegia, incomplete: Secondary | ICD-10-CM | POA: Diagnosis not present

## 2014-06-09 DIAGNOSIS — Z419 Encounter for procedure for purposes other than remedying health state, unspecified: Secondary | ICD-10-CM

## 2014-06-09 DIAGNOSIS — I129 Hypertensive chronic kidney disease with stage 1 through stage 4 chronic kidney disease, or unspecified chronic kidney disease: Secondary | ICD-10-CM | POA: Diagnosis present

## 2014-06-09 DIAGNOSIS — I4891 Unspecified atrial fibrillation: Secondary | ICD-10-CM | POA: Diagnosis present

## 2014-06-09 HISTORY — DX: Carpal tunnel syndrome, left upper limb: G56.02

## 2014-06-09 HISTORY — DX: Hypothyroidism, unspecified: E03.9

## 2014-06-09 HISTORY — DX: Family history of other specified conditions: Z84.89

## 2014-06-09 HISTORY — DX: Frequency of micturition: R35.0

## 2014-06-09 HISTORY — DX: Reserved for inherently not codable concepts without codable children: IMO0001

## 2014-06-09 HISTORY — DX: Anemia, unspecified: D64.9

## 2014-06-09 HISTORY — DX: Essential (primary) hypertension: I10

## 2014-06-09 HISTORY — PX: ANTERIOR CERVICAL DECOMPRESSION/DISCECTOMY FUSION 4 LEVELS: SHX5556

## 2014-06-09 LAB — BASIC METABOLIC PANEL
ANION GAP: 10 (ref 5–15)
BUN: 10 mg/dL (ref 6–23)
CHLORIDE: 104 mmol/L (ref 96–112)
CO2: 25 mmol/L (ref 19–32)
CREATININE: 0.67 mg/dL (ref 0.50–1.10)
Calcium: 9.4 mg/dL (ref 8.4–10.5)
GFR calc Af Amer: >90 mL/min (ref >90)
GFR calc non Af Amer: 85 mL/min — ABNORMAL LOW (ref >90)
Glucose, Bld: 121 mg/dL — ABNORMAL HIGH (ref 70–99)
Potassium: 3.3 mmol/L — ABNORMAL LOW (ref 3.5–5.1)
Sodium: 139 mmol/L (ref 135–145)

## 2014-06-09 LAB — SURGICAL PCR SCREEN
MRSA, PCR: NEGATIVE
Staphylococcus aureus: POSITIVE — AB

## 2014-06-09 LAB — CBC
HEMATOCRIT: 38.6 % (ref 36.0–46.0)
Hemoglobin: 12.9 g/dL (ref 12.0–15.0)
MCH: 30.2 pg (ref 26.0–34.0)
MCHC: 33.4 g/dL (ref 30.0–36.0)
MCV: 90.4 fL (ref 78.0–100.0)
Platelets: 202 10*3/uL (ref 150–400)
RBC: 4.27 MIL/uL (ref 3.87–5.11)
RDW: 13.2 % (ref 11.5–15.5)
WBC: 3.4 10*3/uL — ABNORMAL LOW (ref 4.0–10.5)

## 2014-06-09 SURGERY — ANTERIOR CERVICAL DECOMPRESSION/DISCECTOMY FUSION 4 LEVELS
Anesthesia: General | Site: Spine Cervical

## 2014-06-09 MED ORDER — SODIUM CHLORIDE 0.9 % IV SOLN: 250.0000 mL | INTRAVENOUS | Status: DC

## 2014-06-09 MED ORDER — ACETAMINOPHEN 650 MG RE SUPP: 650.0000 mg | RECTAL | Status: DC | PRN

## 2014-06-09 MED ORDER — GLYCOPYRROLATE 0.2 MG/ML IJ SOLN
INTRAMUSCULAR | Status: DC | PRN
  Administered 2014-06-09: 0.4 mg via INTRAVENOUS

## 2014-06-09 MED ORDER — HYDROMORPHONE HCL 1 MG/ML IJ SOLN
0.5000 mg | INTRAMUSCULAR | Status: DC | PRN
  Administered 2014-06-09 – 2014-06-11 (×3): 1 mg via INTRAVENOUS
  Filled 2014-06-09 (×3): qty 1

## 2014-06-09 MED ORDER — 0.9 % SODIUM CHLORIDE (POUR BTL) OPTIME
TOPICAL | Status: DC | PRN
  Administered 2014-06-09: 1000 mL

## 2014-06-09 MED ORDER — TRAMADOL-ACETAMINOPHEN 37.5-325 MG PO TABS
1.0000 | ORAL_TABLET | ORAL | Status: DC | PRN
  Administered 2014-06-09: 1 via ORAL
  Filled 2014-06-09: qty 1

## 2014-06-09 MED ORDER — MIDAZOLAM HCL 2 MG/2ML IJ SOLN
0.5000 mg | Freq: Once | INTRAMUSCULAR | Status: AC | PRN
  Administered 2014-06-09: 0.5 mg via INTRAVENOUS

## 2014-06-09 MED ORDER — DOCUSATE SODIUM 100 MG PO CAPS
100.0000 mg | ORAL_CAPSULE | Freq: Two times a day (BID) | ORAL | Status: DC
  Administered 2014-06-09 – 2014-06-12 (×7): 100 mg via ORAL
  Filled 2014-06-09 (×7): qty 1

## 2014-06-09 MED ORDER — PHENYLEPHRINE HCL 10 MG/ML IJ SOLN
INTRAMUSCULAR | Status: DC | PRN
  Administered 2014-06-09: 40 ug via INTRAVENOUS
  Administered 2014-06-09 (×2): 80 ug via INTRAVENOUS

## 2014-06-09 MED ORDER — ALUM & MAG HYDROXIDE-SIMETH 200-200-20 MG/5ML PO SUSP: 30.0000 mL | Freq: Four times a day (QID) | ORAL | Status: DC | PRN

## 2014-06-09 MED ORDER — ACETAMINOPHEN 325 MG PO TABS: 650.0000 mg | ORAL_TABLET | ORAL | Status: DC | PRN

## 2014-06-09 MED ORDER — CARVEDILOL 12.5 MG PO TABS
25.0000 mg | ORAL_TABLET | Freq: Every day | ORAL | Status: DC
  Administered 2014-06-10 – 2014-06-12 (×3): 25 mg via ORAL
  Filled 2014-06-09 (×4): qty 2

## 2014-06-09 MED ORDER — VECURONIUM BROMIDE 10 MG IV SOLR: INTRAVENOUS | Status: DC | PRN

## 2014-06-09 MED ORDER — THROMBIN 5000 UNITS EX SOLR
CUTANEOUS | Status: DC | PRN
  Administered 2014-06-09: 10:00:00 via TOPICAL

## 2014-06-09 MED ORDER — LIDOCAINE-EPINEPHRINE 1 %-1:100000 IJ SOLN
INTRAMUSCULAR | Status: DC | PRN
  Administered 2014-06-09: 4 mL

## 2014-06-09 MED ORDER — ONDANSETRON HCL 4 MG/2ML IJ SOLN
INTRAMUSCULAR | Status: DC | PRN
  Administered 2014-06-09: 4 mg via INTRAVENOUS

## 2014-06-09 MED ORDER — FLEET ENEMA 7-19 GM/118ML RE ENEM: 1.0000 | ENEMA | Freq: Once | RECTAL | Status: AC | PRN

## 2014-06-09 MED ORDER — ROCURONIUM BROMIDE 100 MG/10ML IV SOLN
INTRAVENOUS | Status: DC | PRN
  Administered 2014-06-09: 50 mg via INTRAVENOUS

## 2014-06-09 MED ORDER — BUPIVACAINE HCL (PF) 0.5 % IJ SOLN
INTRAMUSCULAR | Status: DC | PRN
  Administered 2014-06-09: 4 mL

## 2014-06-09 MED ORDER — MULTIVITAMINS PO CAPS: 1.0000 | ORAL_CAPSULE | Freq: Every day | ORAL | Status: DC

## 2014-06-09 MED ORDER — PROMETHAZINE HCL 25 MG/ML IJ SOLN
INTRAMUSCULAR | Status: AC
  Filled 2014-06-09: qty 1

## 2014-06-09 MED ORDER — ONDANSETRON HCL 4 MG/2ML IJ SOLN
4.0000 mg | INTRAMUSCULAR | Status: DC | PRN
  Administered 2014-06-09: 4 mg via INTRAVENOUS
  Filled 2014-06-09: qty 2

## 2014-06-09 MED ORDER — AMLODIPINE BESYLATE 5 MG PO TABS
5.0000 mg | ORAL_TABLET | Freq: Every day | ORAL | Status: DC
  Administered 2014-06-09 – 2014-06-12 (×4): 5 mg via ORAL
  Filled 2014-06-09 (×4): qty 1

## 2014-06-09 MED ORDER — HYDROMORPHONE HCL 1 MG/ML IJ SOLN
0.2500 mg | INTRAMUSCULAR | Status: DC | PRN
  Administered 2014-06-09: 0.5 mg via INTRAVENOUS

## 2014-06-09 MED ORDER — THROMBIN 5000 UNITS EX SOLR
CUTANEOUS | Status: DC | PRN
  Administered 2014-06-09 (×5): 5000 [IU] via TOPICAL

## 2014-06-09 MED ORDER — FENTANYL CITRATE (PF) 100 MCG/2ML IJ SOLN
INTRAMUSCULAR | Status: DC | PRN
  Administered 2014-06-09: 150 ug via INTRAVENOUS
  Administered 2014-06-09: 100 ug via INTRAVENOUS
  Administered 2014-06-09: 50 ug via INTRAVENOUS

## 2014-06-09 MED ORDER — HEMOSTATIC AGENTS (NO CHARGE) OPTIME
TOPICAL | Status: DC | PRN
  Administered 2014-06-09: 1 via TOPICAL

## 2014-06-09 MED ORDER — PROMETHAZINE HCL 25 MG/ML IJ SOLN
6.2500 mg | INTRAMUSCULAR | Status: DC | PRN
  Administered 2014-06-09: 6.25 mg via INTRAVENOUS

## 2014-06-09 MED ORDER — POLYETHYLENE GLYCOL 3350 17 G PO PACK: 17.0000 g | PACK | Freq: Every day | ORAL | Status: DC | PRN

## 2014-06-09 MED ORDER — PHENYLEPHRINE HCL 10 MG/ML IJ SOLN
10.0000 mg | INTRAMUSCULAR | Status: DC | PRN
  Administered 2014-06-09: 25 ug/min via INTRAVENOUS

## 2014-06-09 MED ORDER — PHENOL 1.4 % MT LIQD
1.0000 | OROMUCOSAL | Status: DC | PRN
  Filled 2014-06-09: qty 177

## 2014-06-09 MED ORDER — PANTOPRAZOLE SODIUM 40 MG IV SOLR
40.0000 mg | Freq: Every day | INTRAVENOUS | Status: DC
  Administered 2014-06-09: 40 mg via INTRAVENOUS
  Filled 2014-06-09: qty 40

## 2014-06-09 MED ORDER — SODIUM CHLORIDE 0.9 % IJ SOLN
3.0000 mL | Freq: Two times a day (BID) | INTRAMUSCULAR | Status: DC
  Administered 2014-06-10 – 2014-06-11 (×4): 3 mL via INTRAVENOUS

## 2014-06-09 MED ORDER — METHOCARBAMOL 1000 MG/10ML IJ SOLN: 500.0000 mg | Freq: Four times a day (QID) | INTRAVENOUS | Status: DC | PRN

## 2014-06-09 MED ORDER — PROPOFOL 10 MG/ML IV BOLUS
INTRAVENOUS | Status: AC
  Filled 2014-06-09: qty 20

## 2014-06-09 MED ORDER — CEFAZOLIN SODIUM-DEXTROSE 2-3 GM-% IV SOLR
2.0000 g | Freq: Three times a day (TID) | INTRAVENOUS | Status: AC
  Administered 2014-06-09 – 2014-06-10 (×2): 2 g via INTRAVENOUS
  Filled 2014-06-09 (×2): qty 50

## 2014-06-09 MED ORDER — HYDROMORPHONE HCL 1 MG/ML IJ SOLN
INTRAMUSCULAR | Status: AC
  Filled 2014-06-09: qty 1

## 2014-06-09 MED ORDER — FUROSEMIDE 40 MG PO TABS: 40.0000 mg | ORAL_TABLET | Freq: Every day | ORAL | Status: DC | PRN

## 2014-06-09 MED ORDER — FENTANYL CITRATE (PF) 250 MCG/5ML IJ SOLN
INTRAMUSCULAR | Status: AC
  Filled 2014-06-09: qty 5

## 2014-06-09 MED ORDER — OXYCODONE-ACETAMINOPHEN 5-325 MG PO TABS: 1.0000 | ORAL_TABLET | ORAL | Status: DC | PRN

## 2014-06-09 MED ORDER — HYDROCODONE-ACETAMINOPHEN 5-325 MG PO TABS
1.0000 | ORAL_TABLET | ORAL | Status: DC | PRN
  Administered 2014-06-10 (×2): 1 via ORAL
  Administered 2014-06-10 – 2014-06-12 (×4): 2 via ORAL
  Filled 2014-06-09 (×3): qty 2
  Filled 2014-06-09 (×2): qty 1
  Filled 2014-06-09: qty 2

## 2014-06-09 MED ORDER — ENSURE ENLIVE PO LIQD
237.0000 mL | Freq: Two times a day (BID) | ORAL | Status: DC
  Administered 2014-06-10 – 2014-06-12 (×5): 237 mL via ORAL

## 2014-06-09 MED ORDER — VECURONIUM BROMIDE 10 MG IV SOLR
INTRAVENOUS | Status: DC | PRN
  Administered 2014-06-09: 2 mg via INTRAVENOUS

## 2014-06-09 MED ORDER — KCL IN DEXTROSE-NACL 20-5-0.45 MEQ/L-%-% IV SOLN
INTRAVENOUS | Status: DC
  Administered 2014-06-09: 15:00:00 via INTRAVENOUS
  Filled 2014-06-09: qty 1000

## 2014-06-09 MED ORDER — METHOCARBAMOL 500 MG PO TABS
500.0000 mg | ORAL_TABLET | Freq: Four times a day (QID) | ORAL | Status: DC | PRN
  Administered 2014-06-09 – 2014-06-12 (×4): 500 mg via ORAL
  Filled 2014-06-09 (×5): qty 1

## 2014-06-09 MED ORDER — MUPIROCIN 2 % EX OINT
TOPICAL_OINTMENT | CUTANEOUS | Status: AC
  Filled 2014-06-09: qty 22

## 2014-06-09 MED ORDER — LIDOCAINE HCL (CARDIAC) 20 MG/ML IV SOLN
INTRAVENOUS | Status: DC | PRN
  Administered 2014-06-09: 20 mg via INTRAVENOUS

## 2014-06-09 MED ORDER — LACTATED RINGERS IV SOLN
INTRAVENOUS | Status: DC | PRN
  Administered 2014-06-09 (×2): via INTRAVENOUS

## 2014-06-09 MED ORDER — DEXAMETHASONE SODIUM PHOSPHATE 10 MG/ML IJ SOLN
INTRAMUSCULAR | Status: DC | PRN
  Administered 2014-06-09: 10 mg via INTRAVENOUS

## 2014-06-09 MED ORDER — MENTHOL 3 MG MT LOZG
1.0000 | LOZENGE | OROMUCOSAL | Status: DC | PRN
  Administered 2014-06-09: 3 mg via ORAL
  Filled 2014-06-09: qty 9

## 2014-06-09 MED ORDER — MEPERIDINE HCL 25 MG/ML IJ SOLN: 6.2500 mg | INTRAMUSCULAR | Status: DC | PRN

## 2014-06-09 MED ORDER — BISACODYL 10 MG RE SUPP: 10.0000 mg | Freq: Every day | RECTAL | Status: DC | PRN

## 2014-06-09 MED ORDER — MIDAZOLAM HCL 2 MG/2ML IJ SOLN
INTRAMUSCULAR | Status: AC
  Filled 2014-06-09: qty 2

## 2014-06-09 MED ORDER — MUPIROCIN 2 % EX OINT
1.0000 "application " | TOPICAL_OINTMENT | Freq: Once | CUTANEOUS | Status: AC
  Administered 2014-06-09: 1 via TOPICAL

## 2014-06-09 MED ORDER — POTASSIUM CHLORIDE CRYS ER 20 MEQ PO TBCR: 20.0000 meq | EXTENDED_RELEASE_TABLET | Freq: Every day | ORAL | Status: DC | PRN

## 2014-06-09 MED ORDER — SODIUM CHLORIDE 0.9 % IJ SOLN: 3.0000 mL | INTRAMUSCULAR | Status: DC | PRN

## 2014-06-09 MED ORDER — PROPOFOL 10 MG/ML IV BOLUS
INTRAVENOUS | Status: DC | PRN
  Administered 2014-06-09: 80 mg via INTRAVENOUS
  Administered 2014-06-09: 30 mg via INTRAVENOUS

## 2014-06-09 MED ORDER — IRBESARTAN 300 MG PO TABS
300.0000 mg | ORAL_TABLET | Freq: Every day | ORAL | Status: DC
  Administered 2014-06-09 – 2014-06-12 (×4): 300 mg via ORAL
  Filled 2014-06-09 (×4): qty 1

## 2014-06-09 MED ORDER — NEOSTIGMINE METHYLSULFATE 10 MG/10ML IV SOLN
INTRAVENOUS | Status: DC | PRN
  Administered 2014-06-09: 4 mg via INTRAVENOUS

## 2014-06-09 SURGICAL SUPPLY — 82 items
ADH SKN CLS LQ APL DERMABOND (GAUZE/BANDAGES/DRESSINGS) ×1
APL SKNCLS STERI-STRIP NONHPOA (GAUZE/BANDAGES/DRESSINGS)
BENZOIN TINCTURE PRP APPL 2/3 (GAUZE/BANDAGES/DRESSINGS) IMPLANT
BIT DRILL NEURO 2X3.1 SFT TUCH (MISCELLANEOUS) ×1 IMPLANT
BLADE ULTRA TIP 2M (BLADE) ×2 IMPLANT
BNDG GAUZE ELAST 4 BULKY (GAUZE/BANDAGES/DRESSINGS) ×4 IMPLANT
BUR BARREL STRAIGHT FLUTE 4.0 (BURR) ×5 IMPLANT
CAGE SMALL 7X13X15 (Cage) ×8 IMPLANT
CANISTER SUCT 3000ML PPV (MISCELLANEOUS) ×3 IMPLANT
CLOSURE WOUND 1/2 X4 (GAUZE/BANDAGES/DRESSINGS)
CONT SPEC 4OZ CLIKSEAL STRL BL (MISCELLANEOUS) ×3 IMPLANT
COVER MAYO STAND STRL (DRAPES) ×3 IMPLANT
DECANTER SPIKE VIAL GLASS SM (MISCELLANEOUS) ×3 IMPLANT
DERMABOND ADHESIVE PROPEN (GAUZE/BANDAGES/DRESSINGS) ×2
DERMABOND ADVANCED .7 DNX6 (GAUZE/BANDAGES/DRESSINGS) IMPLANT
DRAIN JACKSON PRATT 10MM FLAT (MISCELLANEOUS) ×2 IMPLANT
DRAPE LAPAROTOMY 100X72 PEDS (DRAPES) ×3 IMPLANT
DRAPE MICROSCOPE LEICA (MISCELLANEOUS) ×3 IMPLANT
DRAPE POUCH INSTRU U-SHP 10X18 (DRAPES) ×1 IMPLANT
DRAPE PROXIMA HALF (DRAPES) ×4 IMPLANT
DRILL NEURO 2X3.1 SOFT TOUCH (MISCELLANEOUS) ×3
DRSG OPSITE POSTOP 4X6 (GAUZE/BANDAGES/DRESSINGS) ×2 IMPLANT
DRSG TELFA 3X8 NADH (GAUZE/BANDAGES/DRESSINGS) IMPLANT
DURAPREP 6ML APPLICATOR 50/CS (WOUND CARE) ×3 IMPLANT
ELECT COATED BLADE 2.86 ST (ELECTRODE) ×3 IMPLANT
ELECT REM PT RETURN 9FT ADLT (ELECTROSURGICAL) ×3
ELECTRODE REM PT RTRN 9FT ADLT (ELECTROSURGICAL) ×1 IMPLANT
EVACUATOR SILICONE 100CC (DRAIN) ×2 IMPLANT
GAUZE SPONGE 4X4 12PLY STRL (GAUZE/BANDAGES/DRESSINGS) IMPLANT
GAUZE SPONGE 4X4 16PLY XRAY LF (GAUZE/BANDAGES/DRESSINGS) IMPLANT
GLOVE BIO SURGEON STRL SZ7 (GLOVE) ×4 IMPLANT
GLOVE BIO SURGEON STRL SZ8 (GLOVE) ×5 IMPLANT
GLOVE BIO SURGEON STRL SZ8.5 (GLOVE) ×2 IMPLANT
GLOVE BIOGEL PI IND STRL 7.5 (GLOVE) IMPLANT
GLOVE BIOGEL PI IND STRL 8 (GLOVE) ×1 IMPLANT
GLOVE BIOGEL PI IND STRL 8.5 (GLOVE) ×1 IMPLANT
GLOVE BIOGEL PI INDICATOR 7.5 (GLOVE) ×4
GLOVE BIOGEL PI INDICATOR 8 (GLOVE) ×2
GLOVE BIOGEL PI INDICATOR 8.5 (GLOVE) ×2
GLOVE ECLIPSE 7.5 STRL STRAW (GLOVE) ×1 IMPLANT
GLOVE EXAM NITRILE LRG STRL (GLOVE) IMPLANT
GLOVE EXAM NITRILE MD LF STRL (GLOVE) IMPLANT
GLOVE EXAM NITRILE XL STR (GLOVE) IMPLANT
GLOVE EXAM NITRILE XS STR PU (GLOVE) IMPLANT
GLOVE SURG SS PI 7.0 STRL IVOR (GLOVE) ×4 IMPLANT
GOWN STRL REUS W/ TWL LRG LVL3 (GOWN DISPOSABLE) IMPLANT
GOWN STRL REUS W/ TWL XL LVL3 (GOWN DISPOSABLE) IMPLANT
GOWN STRL REUS W/TWL 2XL LVL3 (GOWN DISPOSABLE) ×2 IMPLANT
GOWN STRL REUS W/TWL LRG LVL3 (GOWN DISPOSABLE) ×3
GOWN STRL REUS W/TWL XL LVL3 (GOWN DISPOSABLE) ×6
HALTER HD/CHIN CERV TRACTION D (MISCELLANEOUS) ×3 IMPLANT
HEMOSTAT POWDER SURGIFOAM 1G (HEMOSTASIS) ×2 IMPLANT
KIT BASIN OR (CUSTOM PROCEDURE TRAY) ×3 IMPLANT
KIT ROOM TURNOVER OR (KITS) ×3 IMPLANT
LIQUID BAND (GAUZE/BANDAGES/DRESSINGS) ×1 IMPLANT
NDL HYPO 18GX1.5 BLUNT FILL (NEEDLE) ×1 IMPLANT
NDL HYPO 25X1 1.5 SAFETY (NEEDLE) ×1 IMPLANT
NDL SPNL 22GX3.5 QUINCKE BK (NEEDLE) ×2 IMPLANT
NEEDLE HYPO 18GX1.5 BLUNT FILL (NEEDLE) IMPLANT
NEEDLE HYPO 25X1 1.5 SAFETY (NEEDLE) ×3 IMPLANT
NEEDLE SPNL 22GX3.5 QUINCKE BK (NEEDLE) ×6 IMPLANT
NS IRRIG 1000ML POUR BTL (IV SOLUTION) ×3 IMPLANT
PACK LAMINECTOMY NEURO (CUSTOM PROCEDURE TRAY) ×3 IMPLANT
PAD ARMBOARD 7.5X6 YLW CONV (MISCELLANEOUS) ×7 IMPLANT
PAD DRESSING TELFA 3X8 NADH (GAUZE/BANDAGES/DRESSINGS) IMPLANT
PIN DISTRACTION 14MM (PIN) ×8 IMPLANT
PLATE ARCHON 4-LEVEL 70MM (Plate) ×2 IMPLANT
PUTTY DBX 5CC (Putty) ×2 IMPLANT
RUBBERBAND STERILE (MISCELLANEOUS) ×6 IMPLANT
SCREW ARCHON SELFTAP 4.0X13 (Screw) ×2 IMPLANT
SPONGE INTESTINAL PEANUT (DISPOSABLE) ×3 IMPLANT
SPONGE SURGIFOAM ABS GEL 100 (HEMOSTASIS) ×2 IMPLANT
STAPLER SKIN PROX WIDE 3.9 (STAPLE) ×2 IMPLANT
STRIP CLOSURE SKIN 1/2X4 (GAUZE/BANDAGES/DRESSINGS) IMPLANT
SUT VIC AB 3-0 SH 8-18 (SUTURE) ×5 IMPLANT
SYR 20ML ECCENTRIC (SYRINGE) ×3 IMPLANT
SYR 3ML LL SCALE MARK (SYRINGE) ×1 IMPLANT
TOWEL OR 17X24 6PK STRL BLUE (TOWEL DISPOSABLE) ×3 IMPLANT
TOWEL OR 17X26 10 PK STRL BLUE (TOWEL DISPOSABLE) ×3 IMPLANT
TRAP SPECIMEN MUCOUS 40CC (MISCELLANEOUS) ×2 IMPLANT
TRAY FOLEY W/METER SILVER 14FR (SET/KITS/TRAYS/PACK) ×2 IMPLANT
WATER STERILE IRR 1000ML POUR (IV SOLUTION) ×3 IMPLANT

## 2014-06-09 NOTE — Op Note (Signed)
06/09/2014  11:02 AM  PATIENT:  Jasmine Hamilton  74 y.o. female  PRE-OPERATIVE DIAGNOSIS:  Cervical cord compression with kyphotic deformity, spondylosis with myelopathy and radiculopathy C 34, C 45, C 56, C 67 levels  POST-OPERATIVE DIAGNOSIS: Cervical cord compression with kyphotic deformity, spondylosis with myelopathy and radiculopathy C 34, C 45, C 56, C 67 levels  PROCEDURE:  Procedure(s): CERVICAL THREE-FOUR,CERVICAL FOUR-FIVE,CERVICAL FIVE-SIX,CERVICAL SIX-SEVEN,ANTERIOR CERVIAL DECOMPRESSION/DISCECTOMY/FUSION (N/A) with PEEK cages, autograft, allograft, anterior cervical plate  SURGEON:  Surgeon(s) and Role:    * Erline Levine, MD - Primary    * Newman Pies, MD - Assisting  PHYSICIAN ASSISTANT:   ASSISTANTS: Poteat, RN   ANESTHESIA:   general  EBL:  Total I/O In: 1000 [I.V.:1000] Out: 1005 [Urine:855; Blood:150]  BLOOD ADMINISTERED:none  DRAINS: (10) Jackson-Pratt drain(s) with closed bulb suction in the prevertebral space   LOCAL MEDICATIONS USED:  MARCAINE    and LIDOCAINE   SPECIMEN:  No Specimen  DISPOSITION OF SPECIMEN:  N/A  COUNTS:  YES  TOURNIQUET:  * No tourniquets in log *  DICTATION: DICTATION: Patient was brought to operating room and following the smooth and uncomplicated induction of general endotracheal anesthesia her head was placed on a horseshoe head holder she was placed in 5 pounds of Holter traction and her anterior neck was prepped and draped in usual sterile fashion. An incision was made on the left side of midline after infiltrating the skin and subcutaneous tissues with local lidocaine. The platysmal layer was incised and subplatysmal dissection was performed exposing the anterior border sternocleidomastoid muscle. Using blunt dissection the carotid sheath was kept lateral and trachea and esophagus kept medial exposing the anterior cervical spine. A bent spinal needle was placed it was felt to be the C 23 and C3-4 levels and this was  confirmed on intraoperative x-ray. Longus coli muscles were taken down from the anterior cervical spine using electrocautery and key elevator and self-retaining retractor was placed. The interspaces from C3/4 to C6/7  were incised and a thorough discectomies were performed. Distraction pins were placed at the C3/4 and C 45 levels. Initial surgery was performed at the C 45 level. Uncinate spurs and central spondylitic ridges were drilled down with a high-speed drill. The spinal cord dura and both C5 nerve roots were widely decompressed. Hemostasis was assured. After trial sizing an 7 mm peek interbody cage was selected and packed with autograft and DBM. The graft was tamped into position and countersunk appropriately.  Distraction pins were placed at the C 34 level. Uncinate spurs and central spondylitic ridges were drilled down with a high-speed drill. The spinal cord dura and both C4 nerve roots were widely decompressed. Hemostasis was assured. After trial sizing an 7 mm peek interbody cage was selected and packed in a similar fashion. The graft was tamped into position and countersunk appropriately. The retractor was moved and the interspace at C 56 was incised and a thorough discectomy was performed. Distraction pins were placed. Uncinate spurs and central spondylitic ridges were drilled down with a high-speed drill. The spinal cord dura and both C 6 nerve roots were widely decompressed. Hemostasis was assured. After trial sizing a 7 mm peek interbody cage was selected and packed in a similar fashion. The graft was tamped into position and countersunk appropriately.The interspace at C 67 was incised and a thorough discectomy was performed. Distraction pins were placed. Uncinate spurs and central spondylitic ridges were drilled down with a high-speed drill.   This interspace was  overgrown with bone and it was drilled open with the high speed drill. The spinal cord dura and both C7 nerve roots were widely  decompressed. Hemostasis was assured. After trial sizing a 7 mm peek interbody cage was selected and packed in a similar fashion. The graft was tamped into position and countersunk appropriately.  Distraction weight was removed. A 70 mm Nuvasive Archon anterior cervical plate was affixed to the cervical spine with 13 mm variable-angle screws 2 at C3, 2 at C4, 2 at C5,  and 2 at C6, and 2 at C7. Plate provided a significant correction of the preoperative kyphotic deformity. All screws were well-positioned and locking mechanisms were engaged. Soft tissues were inspected and found to be in good repair. The wound was irrigated. A final x-ray was obtained with good visualization at C3 through C 5 with the interbody grafts well visualized. A #10 JP drain was inserted through a separate stab incision. The platysma layer was closed with 3-0 Vicryl stitches and the skin was reapproximated with 3-0 Vicryl subcuticular stitches. The wound was dressed with Dermabond and an occlusive dressing. Counts were correct at the end of the case. Patient was extubated and taken to recovery in stable and satisfactory condition.   PLAN OF CARE: Admit to inpatient   PATIENT DISPOSITION:  PACU - hemodynamically stable.   Delay start of Pharmacological VTE agent (>24hrs) due to surgical blood loss or risk of bleeding: yes

## 2014-06-09 NOTE — Progress Notes (Signed)
Pt very anxious, pulling at collar, stating she cant  Breathe, Versed given

## 2014-06-09 NOTE — Brief Op Note (Signed)
06/09/2014  11:02 AM  PATIENT:  Jasmine Hamilton  74 y.o. female  PRE-OPERATIVE DIAGNOSIS:  Cervical cord compression with kyphotic deformity, spondylosis with myelopathy and radiculopathy C 34, C 45, C 56, C 67 levels  POST-OPERATIVE DIAGNOSIS: Cervical cord compression with kyphotic deformity, spondylosis with myelopathy and radiculopathy C 34, C 45, C 56, C 67 levels  PROCEDURE:  Procedure(s): CERVICAL THREE-FOUR,CERVICAL FOUR-FIVE,CERVICAL FIVE-SIX,CERVICAL SIX-SEVEN,ANTERIOR CERVIAL DECOMPRESSION/DISCECTOMY/FUSION (N/A) with PEEK cages, autograft, allograft, anterior cervical plate  SURGEON:  Surgeon(s) and Role:    * Erline Levine, MD - Primary    * Newman Pies, MD - Assisting  PHYSICIAN ASSISTANT:   ASSISTANTS: Poteat, RN   ANESTHESIA:   general  EBL:  Total I/O In: 1000 [I.V.:1000] Out: 1005 [Urine:855; Blood:150]  BLOOD ADMINISTERED:none  DRAINS: (10) Jackson-Pratt drain(s) with closed bulb suction in the prevertebral space   LOCAL MEDICATIONS USED:  MARCAINE    and LIDOCAINE   SPECIMEN:  No Specimen  DISPOSITION OF SPECIMEN:  N/A  COUNTS:  YES  TOURNIQUET:  * No tourniquets in log *  DICTATION: DICTATION: Patient was brought to operating room and following the smooth and uncomplicated induction of general endotracheal anesthesia her head was placed on a horseshoe head holder she was placed in 5 pounds of Holter traction and her anterior neck was prepped and draped in usual sterile fashion. An incision was made on the left side of midline after infiltrating the skin and subcutaneous tissues with local lidocaine. The platysmal layer was incised and subplatysmal dissection was performed exposing the anterior border sternocleidomastoid muscle. Using blunt dissection the carotid sheath was kept lateral and trachea and esophagus kept medial exposing the anterior cervical spine. A bent spinal needle was placed it was felt to be the C 23 and C3-4 levels and this was  confirmed on intraoperative x-ray. Longus coli muscles were taken down from the anterior cervical spine using electrocautery and key elevator and self-retaining retractor was placed. The interspaces from C3/4 to C6/7  were incised and a thorough discectomies were performed. Distraction pins were placed at the C3/4 and C 45 levels. Initial surgery was performed at the C 45 level. Uncinate spurs and central spondylitic ridges were drilled down with a high-speed drill. The spinal cord dura and both C5 nerve roots were widely decompressed. Hemostasis was assured. After trial sizing an 7 mm peek interbody cage was selected and packed with autograft and DBM. The graft was tamped into position and countersunk appropriately.  Distraction pins were placed at the C 34 level. Uncinate spurs and central spondylitic ridges were drilled down with a high-speed drill. The spinal cord dura and both C4 nerve roots were widely decompressed. Hemostasis was assured. After trial sizing an 7 mm peek interbody cage was selected and packed in a similar fashion. The graft was tamped into position and countersunk appropriately. The retractor was moved and the interspace at C 56 was incised and a thorough discectomy was performed. Distraction pins were placed. Uncinate spurs and central spondylitic ridges were drilled down with a high-speed drill. The spinal cord dura and both C 6 nerve roots were widely decompressed. Hemostasis was assured. After trial sizing a 7 mm peek interbody cage was selected and packed in a similar fashion. The graft was tamped into position and countersunk appropriately.The interspace at C 67 was incised and a thorough discectomy was performed. Distraction pins were placed. Uncinate spurs and central spondylitic ridges were drilled down with a high-speed drill.   This interspace was  overgrown with bone and it was drilled open with the high speed drill. The spinal cord dura and both C7 nerve roots were widely  decompressed. Hemostasis was assured. After trial sizing a 7 mm peek interbody cage was selected and packed in a similar fashion. The graft was tamped into position and countersunk appropriately.  Distraction weight was removed. A 70 mm Nuvasive Archon anterior cervical plate was affixed to the cervical spine with 13 mm variable-angle screws 2 at C3, 2 at C4, 2 at C5,  and 2 at C6, and 2 at C7. Plate provided a significant correction of the preoperative kyphotic deformity. All screws were well-positioned and locking mechanisms were engaged. Soft tissues were inspected and found to be in good repair. The wound was irrigated. A final x-ray was obtained with good visualization at C3 through C 5 with the interbody grafts well visualized. A #10 JP drain was inserted through a separate stab incision. The platysma layer was closed with 3-0 Vicryl stitches and the skin was reapproximated with 3-0 Vicryl subcuticular stitches. The wound was dressed with Dermabond and an occlusive dressing. Counts were correct at the end of the case. Patient was extubated and taken to recovery in stable and satisfactory condition.   PLAN OF CARE: Admit to inpatient   PATIENT DISPOSITION:  PACU - hemodynamically stable.   Delay start of Pharmacological VTE agent (>24hrs) due to surgical blood loss or risk of bleeding: yes

## 2014-06-09 NOTE — Anesthesia Postprocedure Evaluation (Signed)
  Anesthesia Post-op Note  Patient: Jasmine Hamilton  Procedure(s) Performed: Procedure(s): CERVICAL THREE-FOUR,CERVICAL FOUR-FIVE,CERVICAL FIVE-SIX,CERVICAL SIX-SEVEN,ANTERIOR CERVIAL DECOMPRESSION/DISCECTOMY/FUSION (N/A)  Patient Location: PACU  Anesthesia Type:General  Level of Consciousness: sedated, patient cooperative and responds to stimulation and voice  Airway and Oxygen Therapy: Patient Spontanous Breathing and Patient connected to nasal cannula oxygen  Post-op Pain: none  Post-op Assessment: Post-op Vital signs reviewed, Patient's Cardiovascular Status Stable, Respiratory Function Stable, Patent Airway, No signs of Nausea or vomiting and Pain level controlled, able to doze  Post-op Vital Signs: Reviewed and stable  Last Vitals:  Filed Vitals:   06/09/14 1239  BP: 156/73  Pulse: 85  Temp: 37.3 C  Resp: 16    Complications: No apparent anesthesia complications

## 2014-06-09 NOTE — Interval H&P Note (Signed)
History and Physical Interval Note:  06/09/2014 7:24 AM  Jasmine Hamilton  has presented today for surgery, with the diagnosis of Cervical cord compression  The various methods of treatment have been discussed with the patient and family. After consideration of risks, benefits and other options for treatment, the patient has consented to  Procedure(s) with comments: C3-4 C4-5 C5-6 C6-7 Anterior cervical decompression/diskectomy/fusion (N/A) - C3-4 C4-5 C5-6 C6-7 Anterior cervical decompression/diskectomy/fusion as a surgical intervention .  The patient's history has been reviewed, patient examined, no change in status, stable for surgery.  I have reviewed the patient's chart and labs.  Questions were answered to the patient's satisfaction.     Jamilah Jean D

## 2014-06-09 NOTE — Transfer of Care (Signed)
Immediate Anesthesia Transfer of Care Note  Patient: Jasmine Hamilton  Procedure(s) Performed: Procedure(s): CERVICAL THREE-FOUR,CERVICAL FOUR-FIVE,CERVICAL FIVE-SIX,CERVICAL SIX-SEVEN,ANTERIOR CERVIAL DECOMPRESSION/DISCECTOMY/FUSION (N/A)  Patient Location: PACU  Anesthesia Type:General  Level of Consciousness: awake, alert , oriented and patient cooperative  Airway & Oxygen Therapy: Patient Spontanous Breathing and Patient connected to face mask oxygen  Post-op Assessment: Report given to RN, Post -op Vital signs reviewed and stable, Patient moving all extremities, Patient moving all extremities X 4 and Patient able to stick tongue midline  Post vital signs: Reviewed and stable  Last Vitals:  Filed Vitals:   06/09/14 0634  BP: 154/92  Pulse: 88  Temp: 36.8 C  Resp: 18    Complications: No apparent anesthesia complications

## 2014-06-09 NOTE — Progress Notes (Signed)
Awake, alert, mildly anxious.  MAEW with improved strength in bilateral D/B/T.   HI s still weak.  Still has lower extremity weakness, left greater than right.  Doing well.

## 2014-06-09 NOTE — Anesthesia Preprocedure Evaluation (Addendum)
Anesthesia Evaluation  Patient identified by MRN, date of birth, ID band Patient awake    Reviewed: Allergy & Precautions, NPO status , Patient's Chart, lab work & pertinent test results, reviewed documented beta blocker date and time   History of Anesthesia Complications Negative for: history of anesthetic complications  Airway Mallampati: II  TM Distance: >3 FB Neck ROM: Full    Dental  (+) Missing, Dental Advisory Given   Pulmonary shortness of breath,  breath sounds clear to auscultation        Cardiovascular hypertension, Pt. on medications and Pt. on home beta blockers - angina+ dysrhythmias Atrial Fibrillation Rhythm:Irregular Rate:Normal  '11 ECHO: EF 55-60%, mild MR, mild TR   Neuro/Psych    GI/Hepatic negative GI ROS, Neg liver ROS,   Endo/Other  Hypothyroidism Morbid obesity  Renal/GU Renal InsufficiencyRenal disease (creat 0.67)     Musculoskeletal  (+) Arthritis -, Osteoarthritis,    Abdominal (+) + obese,   Peds  Hematology  (+) Blood dyscrasia (off Xarelto x5 days), ,   Anesthesia Other Findings   Reproductive/Obstetrics                            Anesthesia Physical Anesthesia Plan  ASA: III  Anesthesia Plan: General   Post-op Pain Management:    Induction: Intravenous  Airway Management Planned: Oral ETT and Video Laryngoscope Planned  Additional Equipment:   Intra-op Plan:   Post-operative Plan: Extubation in OR  Informed Consent: I have reviewed the patients History and Physical, chart, labs and discussed the procedure including the risks, benefits and alternatives for the proposed anesthesia with the patient or authorized representative who has indicated his/her understanding and acceptance.   Dental advisory given  Plan Discussed with: CRNA and Surgeon  Anesthesia Plan Comments: (Plan routine monitors, GETA with VideoGlide intubation )         Anesthesia Quick Evaluation

## 2014-06-09 NOTE — Progress Notes (Addendum)
Pt arrived to 4N via stretcher.  Pt aspen collar on and in place, JP drain to suction.  No c/o pain noted.  Foley catheter below bladder level and unclamped.  Pt welcomed and oriented to the room.  SCD's on and working properly.  Call bell within reach.  Will continue to monitor. Cori Razor, RN

## 2014-06-09 NOTE — H&P (Signed)
Patient ID:   [id number] Patient: Jasmine Hamilton  Date of Birth: 1940-10-21 Visit Type: Office Visit   Date: 06/04/2014 10:00 AM Provider: Marchia Meiers. Vertell Limber MD   This 74 year old female presents for Arm pain.  History of Present Illness: 1.  Arm pain  The patient came back to discuss her cervical imaging findings.  These demonstrate marked spinal stenosis with foraminal stenosis C3 C4, C4 C5, C5 C6 levels.  The C6 C7 level has significant bilateral foraminal stenosis.  I recommended because of the significant progression of degenerative changes in her cervical spine and her progressive weakness that we proceed with surgery.  This will consist of anterior cervical decompression and fusion C3 C4, C4 C5, C5 C6, C6 C7 levels.  Risks and benefits were discussed in detail the patient.  She is referred Aspen/Vista cervical collar.  We went over surgical models.  The patient is taking Xarelto and we have advised her to stop this now.      Medical/Surgical/Interim History Reviewed, no change.  Last detailed document date:01/01/2014.   PAST MEDICAL HISTORY, SURGICAL HISTORY, FAMILY HISTORY, SOCIAL HISTORY AND REVIEW OF SYSTEMS I have reviewed the patient's past medical, surgical, family and social history as well as the comprehensive review of systems as included on the [facility] history form dated 01/01/2014, which I have signed.  Family History: Reviewed, no changes.  Last detailed document: 01/01/2014.   Social History: Tobacco use reviewed. Reviewed, no changes. Last detailed document date: 01/01/2014.      MEDICATIONS(added, continued or stopped this visit): Started Medication Directions Instruction Stopped   amlodipine 5 mg tablet take 1 tablet by oral route  every day     carvedilol 25 mg tablet take 1 tablet by oral route 2 times every day with food     doxazosin 2 mg tablet take 1 tablet by oral route  every day     furosemide 40 mg tablet take 1  tablet by oral route  every day as needed     klor-con M20 36mEq ORAL TABLET Take 1 tablet daily if Lasix is taken     tramadol 37.5 mg-acetaminophen 325 mg tablet take 2 tablet by oral route 3 times every day as needed     valsartan 320 mg tablet take 1 tablet by oral route  every day     Xarelto 20 mg tablet take 1 tablet by oral route  every day with the evening meal       ALLERGIES: Ingredient Reaction Medication Name Comment  CODEINE Unknown     Reviewed, no changes.    Vitals Date Temp F BP Pulse Ht In Wt Lb BMI BSA Pain Score  06/04/2014  129/76 98 63 222 39.33  8/10      IMPRESSION Severe cervical myelopathy with progressive cervical stenosis and weakness.  Proceed with anterior cervical decompression and fusion C3 C4, C4 C5, C5 C6, C6 C7 levels.  Risks and benefits were discussed with the patient in detail and she wishes to proceed.  She has been advised to stop her blood thinner as of today.  Assessment/Plan # Detail Type Description   1. Assessment Progressive focal motor weakness (M62.81).       2. Assessment Foraminal stenosis of cervical region (M99.81).       3. Assessment Radiculopathy, cervical region (M54.12).       4. Assessment Spinal stenosis, cervical region (M48.02).       5. Assessment Cervical myelopathy (G95.9).  Pain Assessment/Treatment Pain Scale: 8/10. Method: Numeric Pain Intensity Scale. Location: arm. Onset: 03/03/2013. Duration: varies. Quality: discomforting. Pain Assessment/Treatment follow-up plan of care: Patient is taking medications as prescribed..  Fall Risk Plan The patient has not fallen in the last year.  Surgery has been scheduled for 06/09/14.  Risks and benefits were discussed in detail with the patient and she wishes to proceed.             Provider:  Marchia Meiers. Vertell Limber MD  06/07/2014 03:07 PM Dictation edited by: Marchia Meiers. Vertell Limber    CC Providers: [facility] [address] [place], [place] [address]              Electronically signed by Marchia Meiers. Vertell Limber MD on 06/07/2014 03:09 PM  Patient ID:   [id number] Patient: Jasmine Hamilton  Date of Birth: 05-07-1940 Visit Type: Office Visit   Date: 05/26/2014 10:45 AM Provider: Marchia Meiers. Vertell Limber MD   This 74 year old female presents for Arm pain.  History of Present Illness: 1.  Arm pain  Leeta Grimme returns having seen Dr. Tomi Likens for neuromuscular issues and Dr. Posey Pronto for a nerve conduction study.  She notes her physical therapy ended and has not been resumed 2 months.  Apparently an order from Dr. Vertell Limber is now required.  Thoracic and lumbar MRIs on [facility] report printed  Patient is currently in a wheelchair.  I reviewed her lumbar MRI which shows that she has spondylolisthesis of L3 on L4 and of L4 and L5 with spinal stenosis.  I have reexamined her and she does have left-sided weakness of hip flexors at 4 minus out of 5 plantar flexion 4+5 dorsiflexion 4+ out of 5 but in addition she has left biceps strength at 4-5 and left triceps at 4-4 minus out of 5 so she is hyperreflexic in her upper and lower extremities with positive Hoffmann signs and upgoing great toes.  I did not believe I hadn't explanation for her upper extremity findings and symptomatic myelopathy and that I would like to therefore obtain a cervical MRI to make sure she does not have any cervical spinal cord compression.      Medical/Surgical/Interim History Reviewed, no change.  Last detailed document date:01/01/2014.   PAST MEDICAL HISTORY, SURGICAL HISTORY, FAMILY HISTORY, SOCIAL HISTORY AND REVIEW OF SYSTEMS I have reviewed the patient's past medical, surgical, family and social history as well as the comprehensive review of systems as included on the [facility] history form dated 01/01/2014, which I have signed.  Family History: Reviewed, no changes.  Last detailed document:  01/01/2014.   Social History: Tobacco use reviewed. Reviewed, no changes. Last detailed document date: 01/01/2014.      MEDICATIONS(added, continued or stopped this visit): Started Medication Directions Instruction Stopped   amlodipine 5 mg tablet take 1 tablet by oral route  every day     carvedilol 25 mg tablet take 1 tablet by oral route 2 times every day with food     doxazosin 2 mg tablet take 1 tablet by oral route  every day     furosemide 40 mg tablet take 1 tablet by oral route  every day as needed     klor-con M20 26mEq ORAL TABLET Take 1 tablet daily if Lasix is taken     tramadol 37.5 mg-acetaminophen 325 mg tablet take 2 tablet by oral route 3 times every day as needed     valsartan  320 mg tablet take 1 tablet by oral route  every day     Xarelto 20 mg tablet take 1 tablet by oral route  every day with the evening meal       ALLERGIES: Ingredient Reaction Medication Name Comment  CODEINE Unknown     Reviewed, no changes.    Vitals Date Temp F BP Pulse Ht In Wt Lb BMI BSA Pain Score  05/26/2014  136/75 97 63 222 39.33  8/10      IMPRESSION The patient is myelopathic and weak.  I was not able to review an MRI of her cervical spine and I have recommended that one be obtained.  Completed Orders (this encounter) Order Details Reason Side Interpretation Result Initial Treatment Date Region  Lifestyle education regarding diet Encouraged to eat a well balanced diet and follow up with primary care physician.         Assessment/Plan # Detail Type Description   1. Assessment Cervical myelopathy (G95.9).       2. Assessment Progressive focal motor weakness (M62.81).       3. Assessment Body mass index (BMI) 39.0-39.9, adult (Z68.39).   Plan Orders Today's instructions / counseling include(s) Lifestyle education regarding diet.         Pain Assessment/Treatment Pain Scale: 8/10. Method: Numeric Pain Intensity Scale. Location: Arm. Onset:  03/03/2013. Duration: varies. Quality: discomforting. Pain Assessment/Treatment follow-up plan of care: Patient is taking medications as prescribed..  Fall Risk Plan The patient has not fallen in the last year.  Patient will follow up with me with cervical imaging.  Orders: Diagnostic Procedures: Assessment Procedure  G95.9 MRI Spinal/cerv W/o Contrast  G95.9 Return to Clinic after study is performed  Instruction(s)/Education: Assessment Instruction  Z68.39 Lifestyle education regarding diet             Provider:  Marchia Meiers. Vertell Limber MD  05/30/2014 08:59 AM Dictation edited by: Marchia Meiers. Vertell Limber    CC Providers: [facility] [address] [place], [place] [address]              Electronically signed by Marchia Meiers. Vertell Limber MD on 05/30/2014 09:00 AM

## 2014-06-09 NOTE — Anesthesia Procedure Notes (Signed)
Procedure Name: Intubation Date/Time: 06/09/2014 7:45 AM Performed by: Shirlyn Goltz Pre-anesthesia Checklist: Patient identified, Emergency Drugs available, Suction available and Patient being monitored Patient Re-evaluated:Patient Re-evaluated prior to inductionOxygen Delivery Method: Circle system utilized Preoxygenation: Pre-oxygenation with 100% oxygen Intubation Type: IV induction Ventilation: Mask ventilation without difficulty and Oral airway inserted - appropriate to patient size Laryngoscope Size: Glidescope Grade View: Grade I Tube type: Oral Tube size: 7.0 mm Number of attempts: 1 Airway Equipment and Method: Stylet and Video-laryngoscopy Placement Confirmation: ETT inserted through vocal cords under direct vision,  positive ETCO2 and breath sounds checked- equal and bilateral Secured at: 20 cm Tube secured with: Tape Dental Injury: Teeth and Oropharynx as per pre-operative assessment

## 2014-06-10 ENCOUNTER — Encounter (HOSPITAL_COMMUNITY): Payer: Self-pay | Admitting: Neurosurgery

## 2014-06-10 DIAGNOSIS — M4712 Other spondylosis with myelopathy, cervical region: Secondary | ICD-10-CM

## 2014-06-10 DIAGNOSIS — G8222 Paraplegia, incomplete: Secondary | ICD-10-CM

## 2014-06-10 MED ORDER — PANTOPRAZOLE SODIUM 40 MG PO TBEC
40.0000 mg | DELAYED_RELEASE_TABLET | Freq: Every day | ORAL | Status: DC
  Administered 2014-06-10 – 2014-06-11 (×2): 40 mg via ORAL
  Filled 2014-06-10 (×2): qty 1

## 2014-06-10 NOTE — Clinical Social Work Note (Signed)
CSW Consult Acknowledged:   CSW received a consult for SNF placement. CSW awaiting PT/OT evaluation to determine the appropriate level of care.      Jonerik Sliker, MSW, LCSWA [phone]  

## 2014-06-10 NOTE — Progress Notes (Signed)
Subjective: Patient reports "I feel pretty good. I can lift my left arm some better, but my left leg doesn't do as well as my right"  Objective: Vital signs in last 24 hours: Temp:  [97 F (36.1 C)-99.9 F (37.7 C)] 99.3 F (37.4 C) (04/26 0558) Pulse Rate:  [76-112] 111 (04/26 0558) Resp:  [14-18] 18 (04/26 0558) BP: (138-179)/(73-98) 138/80 mmHg (04/26 0558) SpO2:  [97 %-100 %] 100 % (04/26 0558) Weight:  [101.6 kg (223 lb 15.8 oz)] 101.6 kg (223 lb 15.8 oz) (04/25 1835)  Intake/Output from previous day: 04/25 0701 - 04/26 0700 In: 1640 [P.O.:240; I.V.:1400] Out: 4205 [Urine:4030; Drains:25; Blood:150] Intake/Output this shift:    Alert, conversant. incision without erythema, swelling, or drainage beneath honeycomb drsg. JP o/p 40ml emptied at start of shift per nsg, with 45ml overnight additional. Some improvement in strength, but weakness persists as expected. Visat Collar in use, fits well.  Lab Results:  Recent Labs  06/09/14 0619  WBC 3.4*  HGB 12.9  HCT 38.6  PLT 202   BMET  Recent Labs  06/09/14 0619  NA 139  K 3.3*  CL 104  CO2 25  GLUCOSE 121*  BUN 10  CREATININE 0.67  CALCIUM 9.4    Studies/Results: Dg Cervical Spine 2-3 Views  06/09/2014   CLINICAL DATA:  C3 through C7 anterior cervical disc fusion.  EXAM: CERVICAL SPINE - 2-3 VIEW  COMPARISON:  MRI scan of May 31, 2014.  FINDINGS: Two intraoperative cross-table portable lateral views of the cervical spine were obtained. The first image demonstrates surgical probes indicating the anterior portions of the C2-3 and C3-4 disc spaces. The second image demonstrates the patient be status post surgical anterior fusion with superior end at C3. Due to overlying soft tissue, the inferior extent of the fusion plate cannot be identified. Only the first 5 cervical vertebra are completely visualized. Interbody spacers are noted as well.  IMPRESSION: Surgical localization as described above for surgical anterior  fusion of cervical spine.   Electronically Signed   By: Marijo Conception, M.D.   On: 06/09/2014 12:30    Assessment/Plan: Improving    LOS: 1 day  Will leave JP in place today for 53ml total overnight o/p. Mobilize with PT/OT. Hopeful of CIR - spoke briefly with Dr. Naaman Plummer.    Verdis Prime 06/10/2014, 8:54 AM

## 2014-06-10 NOTE — Evaluation (Signed)
Physical Therapy Evaluation Patient Details Name: Jasmine Hamilton MRN: [id number] DOB: 01-31-1941 Today's Date: 06/10/2014   History of Present Illness  pt presents post C3-7 ACDF due to cord compression.    Clinical Impression  Pt globally deconditioned and debilitated requiring A for all aspects of mobility.  Pt seems very motivated, but fatigues quickly with activity.  Pt has previously been to CIR for rehab and is interested in CIR again.  Feel pt would benefit from CIR level of therapies to maximize independence and decrease burden of care prior to returning to home.      Follow Up Recommendations CIR    Equipment Recommendations  None recommended by PT    Recommendations for Other Services Rehab consult     Precautions / Restrictions Precautions Precautions: Cervical;Fall Required Braces or Orthoses: Cervical Brace Cervical Brace: Hard collar;At all times Restrictions Weight Bearing Restrictions: No      Mobility  Bed Mobility Overal bed mobility: Needs Assistance Bed Mobility: Rolling;Sidelying to Sit Rolling: Min assist Sidelying to sit: Min assist;HOB elevated       General bed mobility comments: cues for sequencing and technique.  pt needs A with bringing trunk up to sitting.    Transfers Overall transfer level: Needs assistance Equipment used: Rolling walker (2 wheeled) Transfers: Sit to/from Stand Sit to Stand: Min assist         General transfer comment: cues for UE use and getting closer prior to sitting.    Ambulation/Gait Ambulation/Gait assistance: Min assist Ambulation Distance (Feet): 10 Feet (x2) Assistive device: Rolling walker (2 wheeled) Gait Pattern/deviations: Step-through pattern;Decreased stride length;Trunk flexed     General Gait Details: cues for safe use of RW, upright posture, and encouragement.  pt tends to lean heavily on RW.  Fatigues quickly.    Stairs            Wheelchair Mobility    Modified Rankin (Stroke  Patients Only)       Balance Overall balance assessment: Needs assistance Sitting-balance support: Bilateral upper extremity supported;Feet supported Sitting balance-Leahy Scale: Poor     Standing balance support: Bilateral upper extremity supported;During functional activity Standing balance-Leahy Scale: Poor                               Pertinent Vitals/Pain Pain Assessment: 0-10 Pain Score: 4  Pain Location: Neck and throat Pain Descriptors / Indicators: Sore Pain Intervention(s): Monitored during session;Premedicated before session;Repositioned    Home Living Family/patient expects to be discharged to:: Inpatient rehab                      Prior Function Level of Independence: Needs assistance   Gait / Transfers Assistance Needed: Uses Standard Walker for ambulation.    ADL's / Homemaking Assistance Needed: Daughter A with ADLs and performs homemaking tasks.          Hand Dominance        Extremity/Trunk Assessment   Upper Extremity Assessment: Defer to OT evaluation           Lower Extremity Assessment: Generalized weakness      Cervical / Trunk Assessment: Kyphotic  Communication   Communication: No difficulties  Cognition Arousal/Alertness: Awake/alert Behavior During Therapy: WFL for tasks assessed/performed Overall Cognitive Status: Within Functional Limits for tasks assessed                      General  Comments      Exercises        Assessment/Plan    PT Assessment Patient needs continued PT services  PT Diagnosis Difficulty walking;Generalized weakness   PT Problem List Decreased strength;Decreased activity tolerance;Decreased balance;Decreased mobility;Decreased knowledge of use of DME;Decreased knowledge of precautions;Obesity;Pain  PT Treatment Interventions DME instruction;Gait training;Stair training;Functional mobility training;Therapeutic activities;Therapeutic exercise;Balance  training;Neuromuscular re-education;Patient/family education   PT Goals (Current goals can be found in the Care Plan section) Acute Rehab PT Goals Patient Stated Goal: Rehab prior to going home.   PT Goal Formulation: With patient Time For Goal Achievement: 06/24/14 Potential to Achieve Goals: Good    Frequency Min 5X/week   Barriers to discharge        Co-evaluation               End of Session Equipment Utilized During Treatment: Gait belt;Cervical collar Activity Tolerance: Patient limited by fatigue Patient left: in chair;with call bell/phone within reach;with family/visitor present;with chair alarm set Nurse Communication: Mobility status         Time: 2863-8177 PT Time Calculation (min) (ACUTE ONLY): 37 min   Charges:   PT Evaluation $Initial PT Evaluation Tier I: 1 Procedure PT Treatments $Gait Training: 8-22 mins   PT G CodesCatarina Hartshorn, [place] 06/10/2014, 1:26 PM

## 2014-06-10 NOTE — Progress Notes (Signed)
CARE MANAGEMENT NOTE 06/10/2014  Patient:  Jasmine Hamilton, Jasmine Hamilton   Account Number:  [id number]  Date Initiated:  06/10/2014  Documentation initiated by:  Olga Coaster  Subjective/Objective Assessment:   ADMITTED FOR SURGERY     Action/Plan:   CM FOLLOWING FOR DCP   Anticipated DC Date:  06/13/2014   Anticipated DC Plan:  AWAITING FOR PT/OT EVALS FOR DISPOSITION NEEDS     DC Planning Services  CM consult           Status of service:  In process, will continue to follow  Per UR Regulation:  Reviewed for med. necessity/level of care/duration of stay  Comments:  4/26/2016Mindi Slicker RN,BSN,MHA [phone]

## 2014-06-10 NOTE — Consult Note (Signed)
Physical Medicine and Rehabilitation Consult  Reason for Consult: BUE weakness with gait disorder Referring Physician: Dr. Vertell Limber    HPI: Jasmine Hamilton is a 74 y.o. female with history of A fib, DOE, CKD, BUE weakness and numbness with difficulty walking due to severe cervical myelopathy with progressive cervical stenosis and weakness.  Work up revealed cervical cord compression with spondylosis and patient elected to undergo C4-C6 cervical decompression with fusion and PEEK cages on 06/09/14 by Dr. Vertell Limber.  PT/OT evaluations to be done today.    Review of Systems  HENT: Negative for hearing loss.   Eyes: Negative for blurred vision and double vision.  Respiratory: Negative for cough and shortness of breath.   Cardiovascular: Negative for chest pain and palpitations.  Gastrointestinal: Negative for abdominal pain.  Musculoskeletal: Positive for myalgias, back pain and neck pain.  Neurological: Positive for tingling, sensory change, focal weakness (left sided weakness for past 2 months) and weakness. Negative for dizziness and headaches.      Past Medical History  Diagnosis Date  . A-fib     -newly discovered 12/28/09 - rate controlled, Dr Etter Sjogren  . 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 CKD (chronic kidney disease)   . Allergic rhinitis   . Spinal stenosis   . OA (osteoarthritis) of knee   . Shortness of breath dyspnea     with exertion  . Hypertension   . Hypothyroidism   . Anemia   . Family history of adverse reaction to anesthesia     " my mother had too much anesthesia and went into a coma."  . Dysrhythmia     A-Fib  . Carpal tunnel syndrome, left   . Frequent urination     Past Surgical History  Procedure Laterality Date  . Knee replacement- right knee, dr Maureen Ralphs    . Colonoscopy w/ biopsies and polypectomy      Family History  Problem Relation Age of Onset  . Hypertension Mother    . Diabetes Mother   . Diabetes Brother   . Heart attack Brother   . Kidney disease Sister   . Stroke Mother   . Fainting Father     Social History:  Lives alone. Retired Quarry manager.  Youngest daughter has been assisting for the past 2 weeks and another daughter plans on assisting past discharge. She reports that she has never smoked. She has never used smokeless tobacco. She reports that she does not drink alcohol or use illicit drugs.     Allergies  Allergen Reactions  . Codeine Swelling  . Lotensin [Benazepril Hcl] Cough  . Verelan [Verapamil Hcl Er] Swelling    Medications Prior to Admission  Medication Sig Dispense Refill  . amLODipine (NORVASC) 5 MG tablet Take 5 mg by mouth daily.    . carvedilol (COREG) 25 MG tablet Take 25 mg by mouth daily.    . furosemide (LASIX) 40 MG tablet Take 40 mg by mouth daily as needed for fluid.     . Multiple Vitamin (MULTIVITAMIN) capsule Take 1 capsule by mouth daily.    . traMADol-acetaminophen (ULTRACET) 37.5-325 MG per tablet every 4 (four) hours as needed.     . valsartan (DIOVAN) 320 MG tablet Take 320 mg by mouth daily.    Alveda Reasons 20 MG TABS tablet TAKE 1 TABLET BY MOUTH ONCE DAILY WITH SUPPER 30 tablet 5  . potassium chloride SA (K-DUR,KLOR-CON) 20  MEQ tablet Take 1 tablet by mouth daily as needed (only take with lasix).       Home: Home Living Family/patient expects to be discharged to:: Private residence Living Arrangements: Children  Functional History:   Functional Status:  Mobility:          ADL:    Cognition: Cognition Orientation Level: Oriented X4    Blood pressure 151/77, pulse 83, temperature 99.6 F (37.6 C), temperature source Oral, resp. rate 17, height 5' 3.5" (1.613 m), weight 101.6 kg (223 lb 15.8 oz), SpO2 99 %. Physical Exam  Nursing note and vitals reviewed. Constitutional: She is oriented to person, place, and time. She appears well-developed and well-nourished.  HENT:  Head: Normocephalic and  atraumatic.  Eyes: EOM are normal. Pupils are equal, round, and reactive to light.  Neck:  Neck wound with honeycomb dressing.  Cervical collar in place.   Cardiovascular: Regular rhythm.  Tachycardia present.   Respiratory: Effort normal and breath sounds normal. She has no wheezes.  GI: Soft. Bowel sounds are normal. She exhibits no distension. There is tenderness.  Musculoskeletal:  Well healed old R-TKR incision.  Tends to keep LLE rotated outwards.   Neurological: She is alert and oriented to person, place, and time.  Speech clear but hesitant. She had difficulty finding words to answer basic questions and needed redirection.  Numbness left hand and forearm as well as sensory deficits LLE. LUE 2+ deltoid, 3- bicep,tricep, 3 hand, LLE: HF 1+, ke +, adf/pf 1+.    Skin: Skin is warm and dry.  Psychiatric: Her mood appears anxious. Her speech is delayed. She is slowed.    No results found for this or any previous visit (from the past 24 hour(s)). Dg Cervical Spine 2-3 Views  06/09/2014   CLINICAL DATA:  C3 through C7 anterior cervical disc fusion.  EXAM: CERVICAL SPINE - 2-3 VIEW  COMPARISON:  MRI scan of May 31, 2014.  FINDINGS: Two intraoperative cross-table portable lateral views of the cervical spine were obtained. The first image demonstrates surgical probes indicating the anterior portions of the C2-3 and C3-4 disc spaces. The second image demonstrates the patient be status post surgical anterior fusion with superior end at C3. Due to overlying soft tissue, the inferior extent of the fusion plate cannot be identified. Only the first 5 cervical vertebra are completely visualized. Interbody spacers are noted as well.  IMPRESSION: Surgical localization as described above for surgical anterior fusion of cervical spine.   Electronically Signed   By: Marijo Conception, M.D.   On: 06/09/2014 12:30    Assessment/Plan: Diagnosis: cervical stenosis with myelopathy 1. Does the need for close, 24  hr/day medical supervision in concert with the patient's rehab needs make it unreasonable for this patient to be served in a less intensive setting? Yes 2. Co-Morbidities requiring supervision/potential complications: ckd, afib, htn 3. Due to bladder management, bowel management, safety, skin/wound care, disease management, medication administration, pain management and patient education, does the patient require 24 hr/day rehab nursing? Yes 4. Does the patient require coordinated care of a physician, rehab nurse, PT (1-2 hrs/day, 5 days/week) and OT (1-2 hrs/day, 5 days/week) to address physical and functional deficits in the context of the above medical diagnosis(es)? Yes Addressing deficits in the following areas: balance, endurance, locomotion, strength, transferring, bowel/bladder control, bathing, dressing, feeding, grooming, toileting and psychosocial support 5. Can the patient actively participate in an intensive therapy program of at least 3 hrs of therapy per day at least  5 days per week? Yes 6. The potential for patient to make measurable gains while on inpatient rehab is good 7. Anticipated functional outcomes upon discharge from inpatient rehab are supervision and min assist  with PT, supervision and min assist with OT, n/a with SLP. 8. Estimated rehab length of stay to reach the above functional goals is: potentially 15-22 days 9. Does the patient have adequate social supports and living environment to accommodate these discharge functional goals? Yes and Potentially 10. Anticipated D/C setting: Home 11. Anticipated post D/C treatments: [place] therapy 12. Overall Rehab/Functional Prognosis: excellent  RECOMMENDATIONS: This patient's condition is appropriate for continued rehabilitative care in the following setting: CIR Patient has agreed to participate in recommended program. Yes Note that insurance prior authorization may be required for reimbursement for recommended care.  Comment:  Need to identify social supports. Rehab Admissions Coordinator to follow up.  Thanks,  Meredith Staggers, MD, Mellody Drown     06/10/2014

## 2014-06-10 NOTE — Evaluation (Signed)
Occupational Therapy Evaluation Patient Details Name: Jasmine Hamilton MRN: [id number] DOB: 05-30-1940 Today's Date: 06/10/2014    History of Present Illness pt presents post C3-7 ACDF due to cord compression.     Clinical Impression   Pt admitted with the above diagnoses and presents with below problem list. Pt will benefit from continued acute OT to address the below listed deficits and maximize independence with BADLs prior to d/c to next venue. PTA pt has been needing min A for bathing and dressing the past 2-3 weeks due to LUE pain/numbness with decreased fine motor coordination per pt report. Currently pt in min-mod A for ADLs. Pt completed a CIR stay previously and is interested in CIR to increase independence prior to returning home.      Follow Up Recommendations  CIR    Equipment Recommendations  Other (comment) (TBD next venue)    Recommendations for Other Services       Precautions / Restrictions Precautions Precautions: Cervical;Fall Required Braces or Orthoses: Cervical Brace Cervical Brace: Hard collar;At all times Restrictions Weight Bearing Restrictions: No      Mobility Bed Mobility Overal bed mobility: Needs Assistance Bed Mobility: Rolling;Sit to Sidelying Rolling: Min assist Sidelying to sit: Min assist;HOB elevated     Sit to sidelying: Mod assist;HOB elevated General bed mobility comments: cues for sequencing and technique. Mod A to advance BLE.  Transfers Overall transfer level: Needs assistance Equipment used: Rolling walker (2 wheeled) Transfers: Sit to/from [facility] Sit to Stand: Min assist Stand pivot transfers: Min assist       General transfer comment: cues for technique    Balance Overall balance assessment: Needs assistance Sitting-balance support: Bilateral upper extremity supported;Feet supported Sitting balance-Leahy Scale: Poor     Standing balance support: Bilateral upper extremity supported;During  functional activity Standing balance-Leahy Scale: Poor                              ADL Overall ADL's : Needs assistance/impaired Eating/Feeding: Set up;Sitting   Grooming: Set up;Sitting;Cueing for compensatory techniques   Upper Body Bathing: Minimal assitance;Sitting;Cueing for compensatory techniques   Lower Body Bathing: Moderate assistance;Sit to/from stand;Cueing for compensatory techniques;With adaptive equipment   Upper Body Dressing : Set up;Sitting;Cueing for compensatory techniques   Lower Body Dressing: Moderate assistance;Sit to/from stand;With adaptive equipment;Cueing for compensatory techniques   Toilet Transfer: Minimal assistance;Stand-pivot;BSC;RW   Toileting- Clothing Manipulation and Hygiene: Moderate assistance;With adaptive equipment;Cueing for compensatory techniques;Sit to/from stand   Tub/ Shower Transfer: Minimal assistance;Ambulation;3 in 1;Rolling walker     General ADL Comments: Pt completed SPT from recliner to EOB and completed bed mobility as detailed below. Educated pt and daughter on techniques and AE for completion of ADLs with cervical precautions. Educated pt and daughter on Art gallery manager,     Vision     Perception     Praxis      Pertinent Vitals/Pain Pain Assessment: Faces Pain Score: 4  Faces Pain Scale: Hurts little more Pain Location: neck  Pain Descriptors / Indicators: Sore Pain Intervention(s): Monitored during session;Repositioned     Hand Dominance Right   Extremity/Trunk Assessment Upper Extremity Assessment Upper Extremity Assessment: Generalized weakness;RUE deficits/detail;LUE deficits/detail RUE Deficits / Details: numbness/tingling in all digits; RUE grossly stronger than LUE RUE Sensation: decreased light touch LUE Deficits / Details: pain with shoulder flexion/abduction ~40 degrees; numbness/tingling throughout LUE LUE Sensation: decreased light touch LUE Coordination: decreased fine motor    Lower  Extremity Assessment Lower Extremity Assessment: Defer to PT evaluation   Cervical / Trunk Assessment Cervical / Trunk Assessment: Kyphotic   Communication Communication Communication: No difficulties   Cognition Arousal/Alertness: Awake/alert Behavior During Therapy: WFL for tasks assessed/performed Overall Cognitive Status: Within Functional Limits for tasks assessed                     General Comments       Exercises       Shoulder Instructions      Home Living Family/patient expects to be discharged to:: Inpatient rehab Living Arrangements: Children                                      Prior Functioning/Environment Level of Independence: Needs assistance  Gait / Transfers Assistance Needed: Uses Standard Walker for ambulation.   ADL's / Homemaking Assistance Needed: Daughter A with ADLs and performs homemaking tasks. Min A with bathing/dressing the past 2-3 weeks due to LUE weakness/pain.         OT Diagnosis: Generalized weakness;Acute pain   OT Problem List: Decreased strength;Decreased range of motion;Decreased activity tolerance;Impaired balance (sitting and/or standing);Decreased coordination;Decreased knowledge of use of DME or AE;Decreased knowledge of precautions;Impaired UE functional use;Pain;Impaired sensation   OT Treatment/Interventions: Self-care/ADL training;Therapeutic exercise;DME and/or AE instruction;Therapeutic activities;Patient/family education;Balance training;Energy conservation    OT Goals(Current goals can be found in the care plan section) Acute Rehab OT Goals Patient Stated Goal: Rehab prior to going home.   OT Goal Formulation: With patient/family Time For Goal Achievement: 06/17/14 Potential to Achieve Goals: Good ADL Goals Pt Will Perform Upper Body Bathing: with modified independence;with adaptive equipment;sitting Pt Will Perform Lower Body Bathing: with adaptive equipment;sit to/from stand;with min  guard assist Pt Will Perform Upper Body Dressing: with modified independence;with adaptive equipment;sitting Pt Will Perform Lower Body Dressing: with min guard assist;with adaptive equipment;sit to/from stand Pt Will Transfer to Toilet: ambulating;with modified independence;bedside commode Pt Will Perform Toileting - Clothing Manipulation and hygiene: with supervision;sit to/from stand;with adaptive equipment Additional ADL Goal #1: Pt will complete logrolling technique at min A level to prepare for OOB ADLs.  OT Frequency: Min 2X/week   Barriers to D/C:            Co-evaluation              End of Session Equipment Utilized During Treatment: Rolling walker;Cervical collar  Activity Tolerance: Patient tolerated treatment well Patient left: in bed;with call bell/phone within reach;with bed alarm set;with family/visitor present;with SCD's reapplied   Time: 6004-5997 OT Time Calculation (min): 30 min Charges:  OT General Charges $OT Visit: 1 Procedure OT Evaluation $Initial OT Evaluation Tier I: 1 Procedure OT Treatments $Self Care/Home Management : 8-22 mins G-Codes:    Hortencia Pilar 2014/07/07, 1:59 PM

## 2014-06-10 NOTE — Progress Notes (Addendum)
I have left a message for pt's daughter to contact me to discuss rehab venue options pending insurance approval and assistance available once d/c'd. I will follow up tomorrow. [phone]

## 2014-06-10 NOTE — Progress Notes (Signed)
INITIAL NUTRITION ASSESSMENT  DOCUMENTATION CODES Per approved criteria  -Obesity Unspecified   INTERVENTION: Continue Ensure Enlive po BID, each supplement provides 350 kcal and 20 grams of protein.  Encourage adequate PO intake.  NUTRITION DIAGNOSIS: Increased nutrient needs related to s/p surgery as evidenced by estimated nutrition needs.   Goal: Pt to meet >/= 90% of their estimated nutrition needs   Monitor:  PO intake, weight trends, labs, I/O's  Reason for Assessment: MST  74 y.o. female  Admitting Dx: <principal problem not specified>  ASSESSMENT: Pt presents with arm pain. Pt with marked spinal stenosis with foraminal stenosis C3 C4, C4 C5, C5 C6 levels.  PROCEDURE (4/25): CERVICAL THREE-FOUR,CERVICAL FOUR-FIVE,CERVICAL FIVE-SIX,CERVICAL SIX-SEVEN,ANTERIOR CERVIAL DECOMPRESSION/DISCECTOMY/FUSION (N/A) with PEEK cages, autograft, allograft, anterior cervical plate  Pt reports having a decreased appetite since admission. Pt is currently on a full liquid diet. Current meal completion has been 80%. Pt complains of a sore throat. Pt reports PTA she was eating fine with 3 meals a day and no other difficulties. Weight has been stable. Pt currently has Ensure ordered. Will continue with current orders. Pt was encouraged to eat her food at meals and to drink her supplements.   Pt with no observed significant fat or muscle mass loss.  Labs: Low potassium and GFR.  Height: Ht Readings from Last 1 Encounters:  06/09/14 5' 3.5" (1.613 m)    Weight: Wt Readings from Last 1 Encounters:  06/09/14 223 lb 15.8 oz (101.6 kg)    Ideal Body Weight: 118 lbs  % Ideal Body Weight: 189%  Wt Readings from Last 10 Encounters:  06/09/14 223 lb 15.8 oz (101.6 kg)  05/28/14 223 lb (101.152 kg)  03/10/14 229 lb (103.874 kg)  02/17/14 236 lb (107.049 kg)  10/23/13 236 lb (107.049 kg)  07/15/13 237 lb 1.9 oz (107.557 kg)  12/28/12 245 lb (111.131 kg)    Usual Body Weight: 223  lbs  % Usual Body Weight: 100%  BMI:  Body mass index is 39.05 kg/(m^2). Class II obesity  Estimated Nutritional Needs: Kcal: 1800-2000 Protein: 90-110 grams Fluid: 1.8 - 2 L/day  Skin: Incision on neck  Diet Order: Diet full liquid Room service appropriate?: Yes; Fluid consistency:: Thin  EDUCATION NEEDS: -No education needs identified at this time   Intake/Output Summary (Last 24 hours) at 06/10/14 1022 Last data filed at 06/10/14 0640  Gross per 24 hour  Intake    640 ml  Output   3375 ml  Net  -2735 ml    Last BM: PTA  Labs:   Recent Labs Lab 06/09/14 0619  NA 139  K 3.3*  CL 104  CO2 25  BUN 10  CREATININE 0.67  CALCIUM 9.4  GLUCOSE 121*    CBG (last 3)  No results for input(s): GLUCAP in the last 72 hours.  Scheduled Meds: . amLODipine  5 mg Oral Daily  . carvedilol  25 mg Oral Daily  . docusate sodium  100 mg Oral BID  . feeding supplement (ENSURE ENLIVE)  237 mL Oral BID BM  . irbesartan  300 mg Oral Daily  . pantoprazole  40 mg Oral QHS  . sodium chloride  3 mL Intravenous Q12H    Continuous Infusions: . sodium chloride    . dextrose 5 % and 0.45 % NaCl with KCl 20 mEq/L 75 mL/hr at 06/09/14 1434    Past Medical History  Diagnosis Date  . A-fib     -newly discovered 12/28/09 - rate controlled, Dr  Skaines  . 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 CKD (chronic kidney disease)   . Allergic rhinitis   . Spinal stenosis   . OA (osteoarthritis) of knee   . Shortness of breath dyspnea     with exertion  . Hypertension   . Hypothyroidism   . Anemia   . Family history of adverse reaction to anesthesia     " my mother had too much anesthesia and went into a coma."  . Dysrhythmia     A-Fib  . Carpal tunnel syndrome, left   . Frequent urination     Past Surgical History  Procedure Laterality Date  . Knee replacement- right knee, dr Maureen Ralphs    . Colonoscopy w/  biopsies and polypectomy      Kallie Locks, MS, RD, LDN Pager # [phone] After hours/ weekend pager # [phone]

## 2014-06-11 NOTE — Progress Notes (Signed)
Physical Therapy Treatment Patient Details Name: Jasmine Hamilton MRN: [id number] DOB: 1941-01-31 Today's Date: 06/11/2014    History of Present Illness pt presents post C3-7 ACDF due to cord compression.      PT Comments    Pt able to increase ambulation distance and activity tolerance today.  Pt does need seated rest break in between bouts of ambulation.  Continue to feel pt would benefit from CIR at D/C.  Will continue to follow.    Follow Up Recommendations  CIR     Equipment Recommendations  None recommended by PT    Recommendations for Other Services Rehab consult     Precautions / Restrictions Precautions Precautions: Cervical;Fall Required Braces or Orthoses: Cervical Brace Cervical Brace: Hard collar;At all times Restrictions Weight Bearing Restrictions: No    Mobility  Bed Mobility Overal bed mobility: Needs Assistance Bed Mobility: Sit to Sidelying         Sit to sidelying: Mod assist General bed mobility comments: A with Bil LEs to return to bed.    Transfers Overall transfer level: Needs assistance Equipment used: Rolling walker (2 wheeled) Transfers: Sit to/from Stand Sit to Stand: Min assist         General transfer comment: cues for reaching back to bed prior to coming to sit.    Ambulation/Gait Ambulation/Gait assistance: Min assist Ambulation Distance (Feet): 30 Feet (x2) Assistive device: Rolling walker (2 wheeled) Gait Pattern/deviations: Step-through pattern;Decreased stride length;Trunk flexed     General Gait Details: pt continues to lean heavily on RW and needs cues for more upright psoture and positioning within RW.     Stairs            Wheelchair Mobility    Modified Rankin (Stroke Patients Only)       Balance Overall balance assessment: Needs assistance Sitting-balance support: Feet supported;Single extremity supported Sitting balance-Leahy Scale: Poor     Standing balance support: Bilateral upper  extremity supported;During functional activity Standing balance-Leahy Scale: Poor                      Cognition Arousal/Alertness: Awake/alert Behavior During Therapy: WFL for tasks assessed/performed Overall Cognitive Status: Within Functional Limits for tasks assessed                      Exercises      General Comments        Pertinent Vitals/Pain Pain Assessment: 0-10 Pain Score: 5  Pain Location: Neck and throat Pain Descriptors / Indicators: Sore Pain Intervention(s): Monitored during session;Repositioned;Patient requesting pain meds-RN notified    Home Living                      Prior Function            PT Goals (current goals can now be found in the care plan section) Acute Rehab PT Goals Patient Stated Goal: Rehab prior to going home.   PT Goal Formulation: With patient Time For Goal Achievement: 06/24/14 Potential to Achieve Goals: Good Progress towards PT goals: Progressing toward goals    Frequency  Min 5X/week    PT Plan Current plan remains appropriate    Co-evaluation             End of Session Equipment Utilized During Treatment: Gait belt;Cervical collar Activity Tolerance: Patient limited by fatigue Patient left: in bed;with call bell/phone within reach;with family/visitor present     Time: 4268-3419 PT Time Calculation (  min) (ACUTE ONLY): 21 min  Charges:  $Gait Training: 8-22 mins                    G CodesCatarina Hartshorn, [place] 06/11/2014, 3:09 PM

## 2014-06-11 NOTE — Progress Notes (Signed)
Subjective: Patient reports "I'm doing pretty good"  Objective: Vital signs in last 24 hours: Temp:  [98.3 F (36.8 C)-99.6 F (37.6 C)] 98.4 F (36.9 C) (04/27 0628) Pulse Rate:  [83-110] 99 (04/27 0628) Resp:  [17-20] 18 (04/27 0628) BP: (144-160)/(77-98) 154/98 mmHg (04/27 0628) SpO2:  [95 %-99 %] 99 % (04/27 0628)  Intake/Output from previous day: 04/26 0701 - 04/27 0700 In: -  Out: 40 [Drains:40] Intake/Output this shift: Total I/O In: 120 [P.O.:120] Out: 20 [Drains:20]  Alert, conversant. Reports good pain control with po meds. JP patent, ~74ml since yesterday.  Incision without erythema, swelling, or drainage. No change in exam, myelopathy persists as expected.   Lab Results:  Recent Labs  06/09/14 0619  WBC 3.4*  HGB 12.9  HCT 38.6  PLT 202   BMET  Recent Labs  06/09/14 0619  NA 139  K 3.3*  CL 104  CO2 25  GLUCOSE 121*  BUN 10  CREATININE 0.67  CALCIUM 9.4    Studies/Results: Dg Cervical Spine 2-3 Views  06/09/2014   CLINICAL DATA:  C3 through C7 anterior cervical disc fusion.  EXAM: CERVICAL SPINE - 2-3 VIEW  COMPARISON:  MRI scan of May 31, 2014.  FINDINGS: Two intraoperative cross-table portable lateral views of the cervical spine were obtained. The first image demonstrates surgical probes indicating the anterior portions of the C2-3 and C3-4 disc spaces. The second image demonstrates the patient be status post surgical anterior fusion with superior end at C3. Due to overlying soft tissue, the inferior extent of the fusion plate cannot be identified. Only the first 5 cervical vertebra are completely visualized. Interbody spacers are noted as well.  IMPRESSION: Surgical localization as described above for surgical anterior fusion of cervical spine.   Electronically Signed   By: Marijo Conception, M.D.   On: 06/09/2014 12:30    Assessment/Plan: Improving   LOS: 2 days  JP pulled on Dr. Melven Sartorius order, DSD to site. Honeycomb removed from incision for  access to drain. Dermabond remains intact. Pt tolerated well, reporting only burning sensation with drain removal.  Hopeful of insurance approval for CIR. Continue to mobilize today with PT/OT.   Verdis Prime 06/11/2014, 8:19 AM

## 2014-06-11 NOTE — Progress Notes (Signed)
I have insurance approval for admission to inpt rehab and bed likely available tomorrow. Pt and her dtr, Elmo Putt prefer admit to CIR. I will notify Dr. Vertell Limber and Aaron Edelman and follow up in the morning. RN CM and SW made aware.[phone]

## 2014-06-11 NOTE — Progress Notes (Addendum)
I met with pt and her daughter, Estill Batten, at bedside. We discussed a possible inpt rehab stay pending insurance approval and bed availability. Pt had received inpt rehab 2001 after a TKR. We discussed differences in cost as well as intensity of rehab. Alecia plans to take FMLA for pt to eventually return home. I will alert team as soon as I hear from insurance to assist with planning dispo. [phone]

## 2014-06-12 ENCOUNTER — Inpatient Hospital Stay (HOSPITAL_COMMUNITY)
Admission: RE | Admit: 2014-06-12 | Discharge: 2014-06-15 | DRG: 552 | Disposition: A | Discharge status: Other Acute Inpatient Hospital | Payer: Medicare Other | Source: Intra-hospital | Attending: Physical Medicine & Rehabilitation | Admitting: Physical Medicine & Rehabilitation

## 2014-06-12 DIAGNOSIS — I959 Hypotension, unspecified: Secondary | ICD-10-CM | POA: Diagnosis present

## 2014-06-12 DIAGNOSIS — K5901 Slow transit constipation: Secondary | ICD-10-CM | POA: Diagnosis not present

## 2014-06-12 DIAGNOSIS — R109 Unspecified abdominal pain: Secondary | ICD-10-CM | POA: Diagnosis not present

## 2014-06-12 DIAGNOSIS — I10 Essential (primary) hypertension: Secondary | ICD-10-CM | POA: Diagnosis present

## 2014-06-12 DIAGNOSIS — G8250 Quadriplegia, unspecified: Secondary | ICD-10-CM

## 2014-06-12 DIAGNOSIS — N181 Chronic kidney disease, stage 1: Secondary | ICD-10-CM | POA: Diagnosis present

## 2014-06-12 DIAGNOSIS — I469 Cardiac arrest, cause unspecified: Secondary | ICD-10-CM | POA: Diagnosis not present

## 2014-06-12 DIAGNOSIS — I129 Hypertensive chronic kidney disease with stage 1 through stage 4 chronic kidney disease, or unspecified chronic kidney disease: Secondary | ICD-10-CM | POA: Diagnosis present

## 2014-06-12 DIAGNOSIS — K5900 Constipation, unspecified: Secondary | ICD-10-CM

## 2014-06-12 DIAGNOSIS — G43A0 Cyclical vomiting, not intractable: Secondary | ICD-10-CM | POA: Diagnosis not present

## 2014-06-12 DIAGNOSIS — E876 Hypokalemia: Secondary | ICD-10-CM | POA: Diagnosis present

## 2014-06-12 DIAGNOSIS — R0602 Shortness of breath: Secondary | ICD-10-CM | POA: Diagnosis not present

## 2014-06-12 DIAGNOSIS — M4802 Spinal stenosis, cervical region: Principal | ICD-10-CM | POA: Diagnosis present

## 2014-06-12 DIAGNOSIS — N189 Chronic kidney disease, unspecified: Secondary | ICD-10-CM | POA: Diagnosis present

## 2014-06-12 DIAGNOSIS — M62838 Other muscle spasm: Secondary | ICD-10-CM | POA: Diagnosis present

## 2014-06-12 DIAGNOSIS — I4891 Unspecified atrial fibrillation: Secondary | ICD-10-CM | POA: Diagnosis present

## 2014-06-12 DIAGNOSIS — G959 Disease of spinal cord, unspecified: Secondary | ICD-10-CM | POA: Diagnosis present

## 2014-06-12 DIAGNOSIS — R110 Nausea: Secondary | ICD-10-CM | POA: Diagnosis not present

## 2014-06-12 DIAGNOSIS — I482 Chronic atrial fibrillation: Secondary | ICD-10-CM | POA: Diagnosis not present

## 2014-06-12 MED ORDER — ACETAMINOPHEN 325 MG PO TABS: 325.0000 mg | ORAL_TABLET | ORAL | Status: DC | PRN

## 2014-06-12 MED ORDER — METHOCARBAMOL 750 MG PO TABS
750.0000 mg | ORAL_TABLET | Freq: Four times a day (QID) | ORAL | Status: DC
  Administered 2014-06-12 – 2014-06-15 (×10): 750 mg via ORAL
  Filled 2014-06-12 (×15): qty 1

## 2014-06-12 MED ORDER — ENSURE ENLIVE PO LIQD
237.0000 mL | Freq: Two times a day (BID) | ORAL | Status: DC
  Administered 2014-06-13 – 2014-06-14 (×2): 237 mL via ORAL

## 2014-06-12 MED ORDER — ALUM & MAG HYDROXIDE-SIMETH 200-200-20 MG/5ML PO SUSP
30.0000 mL | ORAL | Status: DC | PRN
  Administered 2014-06-14: 30 mL via ORAL
  Filled 2014-06-12: qty 30

## 2014-06-12 MED ORDER — ALUM & MAG HYDROXIDE-SIMETH 200-200-20 MG/5ML PO SUSP: 30.0000 mL | Freq: Four times a day (QID) | ORAL | Status: DC | PRN

## 2014-06-12 MED ORDER — ONDANSETRON HCL 4 MG/2ML IJ SOLN: 4.0000 mg | INTRAMUSCULAR | Status: DC | PRN

## 2014-06-12 MED ORDER — DOCUSATE SODIUM 100 MG PO CAPS
100.0000 mg | ORAL_CAPSULE | Freq: Two times a day (BID) | ORAL | Status: DC
  Administered 2014-06-12 – 2014-06-15 (×6): 100 mg via ORAL
  Filled 2014-06-12 (×8): qty 1

## 2014-06-12 MED ORDER — CARVEDILOL 25 MG PO TABS
25.0000 mg | ORAL_TABLET | Freq: Every day | ORAL | Status: DC
  Administered 2014-06-13 – 2014-06-15 (×3): 25 mg via ORAL
  Filled 2014-06-12 (×4): qty 1

## 2014-06-12 MED ORDER — POLYETHYLENE GLYCOL 3350 17 G PO PACK
17.0000 g | PACK | Freq: Every day | ORAL | Status: DC | PRN
  Administered 2014-06-14: 17 g via ORAL
  Filled 2014-06-12 (×2): qty 1

## 2014-06-12 MED ORDER — DIPHENHYDRAMINE HCL 12.5 MG/5ML PO ELIX: 12.5000 mg | ORAL_SOLUTION | Freq: Four times a day (QID) | ORAL | Status: DC | PRN

## 2014-06-12 MED ORDER — PANTOPRAZOLE SODIUM 40 MG PO TBEC
40.0000 mg | DELAYED_RELEASE_TABLET | Freq: Every day | ORAL | Status: DC
  Administered 2014-06-12 – 2014-06-14 (×2): 40 mg via ORAL
  Filled 2014-06-12 (×4): qty 1

## 2014-06-12 MED ORDER — PROCHLORPERAZINE EDISYLATE 5 MG/ML IJ SOLN
5.0000 mg | Freq: Four times a day (QID) | INTRAMUSCULAR | Status: DC | PRN
  Filled 2014-06-12: qty 2

## 2014-06-12 MED ORDER — AMLODIPINE BESYLATE 5 MG PO TABS
5.0000 mg | ORAL_TABLET | Freq: Every day | ORAL | Status: DC
Start: 2014-06-13 — End: 2014-06-15
  Administered 2014-06-13 – 2014-06-15 (×3): 5 mg via ORAL
  Filled 2014-06-12 (×4): qty 1

## 2014-06-12 MED ORDER — POTASSIUM CHLORIDE CRYS ER 20 MEQ PO TBCR: 20.0000 meq | EXTENDED_RELEASE_TABLET | Freq: Every day | ORAL | Status: DC | PRN

## 2014-06-12 MED ORDER — TRAZODONE HCL 50 MG PO TABS: 25.0000 mg | ORAL_TABLET | Freq: Every evening | ORAL | Status: DC | PRN

## 2014-06-12 MED ORDER — FLEET ENEMA 7-19 GM/118ML RE ENEM: 1.0000 | ENEMA | Freq: Once | RECTAL | Status: AC | PRN

## 2014-06-12 MED ORDER — BISACODYL 10 MG RE SUPP
10.0000 mg | Freq: Every day | RECTAL | Status: DC | PRN
  Administered 2014-06-13: 10 mg via RECTAL
  Filled 2014-06-12: qty 1

## 2014-06-12 MED ORDER — TRAMADOL-ACETAMINOPHEN 37.5-325 MG PO TABS
1.0000 | ORAL_TABLET | ORAL | Status: DC | PRN
  Administered 2014-06-14: 1 via ORAL
  Filled 2014-06-12: qty 1

## 2014-06-12 MED ORDER — HYDROCODONE-ACETAMINOPHEN 5-325 MG PO TABS
1.0000 | ORAL_TABLET | ORAL | Status: DC | PRN
  Administered 2014-06-12 – 2014-06-13 (×2): 2 via ORAL
  Filled 2014-06-12 (×2): qty 2

## 2014-06-12 MED ORDER — PROCHLORPERAZINE MALEATE 5 MG PO TABS
5.0000 mg | ORAL_TABLET | Freq: Four times a day (QID) | ORAL | Status: DC | PRN
  Filled 2014-06-12: qty 2

## 2014-06-12 MED ORDER — IRBESARTAN 300 MG PO TABS
300.0000 mg | ORAL_TABLET | Freq: Every day | ORAL | Status: DC
  Administered 2014-06-13 – 2014-06-15 (×3): 300 mg via ORAL
  Filled 2014-06-12 (×5): qty 1

## 2014-06-12 MED ORDER — GUAIFENESIN-DM 100-10 MG/5ML PO SYRP: 5.0000 mL | ORAL_SOLUTION | Freq: Four times a day (QID) | ORAL | Status: DC | PRN

## 2014-06-12 MED ORDER — PROCHLORPERAZINE 25 MG RE SUPP
12.5000 mg | Freq: Four times a day (QID) | RECTAL | Status: DC | PRN
  Administered 2014-06-13: 12.5 mg via RECTAL
  Filled 2014-06-12 (×2): qty 1

## 2014-06-12 NOTE — Progress Notes (Signed)
An inpt rehab bed is available today and MD is aware. I will make the arrangements for admission today. Pt and her daughter, Estill Batten, at bedside and in agreement. [phone]

## 2014-06-12 NOTE — Progress Notes (Signed)
Subjective: Patient reports "I had some spasms in the back of my neck, but they gave me a pill"  Objective: Vital signs in last 24 hours: Temp:  [98.2 F (36.8 C)-98.6 F (37 C)] 98.4 F (36.9 C) (04/28 0610) Pulse Rate:  [80-105] 105 (04/28 0610) Resp:  [18] 18 (04/28 0610) BP: (116-154)/(75-85) 151/84 mmHg (04/28 0610) SpO2:  [98 %-100 %] 98 % (04/28 0610)  Intake/Output from previous day: 04/27 0701 - 04/28 0700 In: 480 [P.O.:480] Out: 20 [Drains:20] Intake/Output this shift:    Alert, smiling, conversant. Vista Collar in use. Incision flat, without erythema or drainage. 2x2 at drain site with minimal drainage. Strength improving, able to lift LLE off bed today with some effort, but improved. LUE strength improving as well. Pain controlled on po meds.  Lab Results: No results for input(s): WBC, HGB, HCT, PLT in the last 72 hours. BMET No results for input(s): NA, K, CL, CO2, GLUCOSE, BUN, CREATININE, CALCIUM in the last 72 hours.  Studies/Results: No results found.  Assessment/Plan: Improving slowly   LOS: 3 days  CIR today/when bed available.    Jasmine Hamilton 06/12/2014, 7:34 AM

## 2014-06-12 NOTE — H&P (Addendum)
Physical Medicine and Rehabilitation Admission H&P   CC: BUE weakness with gait disorder   HPI: Jasmine Hamilton is a 74 y.o. female with history of A fib, DOE, CKD, BUE weakness and numbness with difficulty walking due to severe cervical myelopathy with progressive cervical stenosis and weakness. Work up revealed cervical cord compression with spondylosis and patient elected to undergo C4-C6 cervical decompression with fusion and PEEK cages on 06/09/14 by Dr. Vertell Limber. Drain removed yesterday without difficulty. Therapy ongoing and CIR recommended for follow by MD and Rehab team. Patient cleared for admission today.   Patient has some throat discomfort with swallowing but also has incisional pain that she complains of. Denies any burning pain in the hands or the feet.  Review of Systems  HENT: Negative for hearing loss.  Eyes: Negative for blurred vision and double vision.  Respiratory: Negative for cough and shortness of breath.  Cardiovascular: Negative for chest pain and palpitations.  Gastrointestinal: Negative for heartburn, nausea, abdominal pain and constipation.  Genitourinary: Positive for urgency (chronic with urge incontinence) and frequency.  Musculoskeletal: Positive for neck pain (neck and shoulder pain).  Neurological: Positive for tingling and focal weakness. Negative for dizziness and headaches.      Past Medical History  Diagnosis Date  . A-fib     -newly discovered 12/28/09 - rate controlled, Dr Etter Sjogren  . 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 CKD (chronic kidney disease)   . Allergic rhinitis   . Spinal stenosis   . OA (osteoarthritis) of knee   . Shortness of breath dyspnea     with exertion  . Hypertension   . Hypothyroidism   . Anemia   . Family history of adverse reaction to anesthesia     " my mother had too much anesthesia and  went into a coma."  . Dysrhythmia     A-Fib  . Carpal tunnel syndrome, left   . Frequent urination     Past Surgical History  Procedure Laterality Date  . Knee replacement- right knee, dr Maureen Ralphs    . Colonoscopy w/ biopsies and polypectomy    . Anterior cervical decompression/discectomy fusion 4 levels N/A 06/09/2014    Procedure: CERVICAL THREE-FOUR,CERVICAL FOUR-FIVE,CERVICAL FIVE-SIX,CERVICAL SIX-SEVEN,ANTERIOR CERVIAL DECOMPRESSION/DISCECTOMY/FUSION; Surgeon: Erline Levine, MD; Location: [facility]; Service: Neurosurgery; Laterality: N/A;    Family History  Problem Relation Age of Onset  . Hypertension Mother   . Diabetes Mother   . Diabetes Brother   . Heart attack Brother   . Kidney disease Sister   . Stroke Mother   . Fainting Father     Social History: Lives alone. Retired Quarry manager. Youngest daughter has been assisting for the past 2 weeks and another daughter plans on assisting past discharge. She reports that she has never smoked. She has never used smokeless tobacco. She reports that she does not drink alcohol or use illicit drugs.    Allergies  Allergen Reactions  . Codeine Swelling  . Lotensin [Benazepril Hcl] Cough  . Verelan [Verapamil Hcl Er] Swelling    Medications Prior to Admission  Medication Sig Dispense Refill  . amLODipine (NORVASC) 5 MG tablet Take 5 mg by mouth daily.    . carvedilol (COREG) 25 MG tablet Take 25 mg by mouth daily.    . furosemide (LASIX) 40 MG tablet Take 40 mg by mouth daily as needed for fluid.     . Multiple Vitamin (MULTIVITAMIN) capsule Take 1  capsule by mouth daily.    . traMADol-acetaminophen (ULTRACET) 37.5-325 MG per tablet every 4 (four) hours as needed.     . valsartan (DIOVAN) 320 MG tablet Take 320 mg by mouth daily.    Alveda Reasons 20 MG TABS tablet TAKE 1 TABLET BY MOUTH ONCE DAILY WITH SUPPER 30  tablet 5  . potassium chloride SA (K-DUR,KLOR-CON) 20 MEQ tablet Take 1 tablet by mouth daily as needed (only take with lasix).       Home: Home Living Family/patient expects to be discharged to:: Inpatient rehab Living Arrangements: Other (Comment) (Pt's 74 yo old autistic son, Mortimer Fries, lives with her. He h) Available Help at Discharge: Available 24 hours/day (Dtr, Estill Batten, to take FMLA for pt to d/c home with 24/7 super) Type of Home: House Home Access: Stairs to enter [employer] of Steps: 6 Entrance Stairs-Rails: Right, Left, Can reach both Home Layout: One level Lives With: Son  Functional History: Prior Function Level of Independence: Needs assistance Gait / Transfers Assistance Needed: Warden/ranger for ambulation.  ADL's / Homemaking Assistance Needed: Nicole Kindred, dtr was assisting with adls over past 2 to 3 weeks due to decline. Overall decline for 3 months Comments: Pt was Mod I with cane prior to 1/16. Adult son, Mortimer Fries, lives with her. Autistic/mute  Functional Status:  Mobility: Bed Mobility Overal bed mobility: Needs Assistance Bed Mobility: Sit to Sidelying Rolling: Min assist Sidelying to sit: Min assist, HOB elevated Sit to sidelying: Mod assist General bed mobility comments: A with Bil LEs to return to bed.  Transfers Overall transfer level: Needs assistance Equipment used: Rolling walker (2 wheeled) Transfers: Sit to/from Stand Sit to Stand: Min assist Stand pivot transfers: Min assist General transfer comment: cues for reaching back to bed prior to coming to sit.  Ambulation/Gait Ambulation/Gait assistance: Min assist Ambulation Distance (Feet): 30 Feet (x2) Assistive device: Rolling walker (2 wheeled) Gait Pattern/deviations: Step-through pattern, Decreased stride length, Trunk flexed General Gait Details: pt continues to lean heavily on RW and needs cues for more upright psoture and positioning within RW.      ADL: ADL Overall ADL's : Needs assistance/impaired Eating/Feeding: Set up, Sitting Grooming: Set up, Sitting, Cueing for compensatory techniques Upper Body Bathing: Minimal assitance, Sitting, Cueing for compensatory techniques Lower Body Bathing: Moderate assistance, Sit to/from stand, Cueing for compensatory techniques, With adaptive equipment Upper Body Dressing : Set up, Sitting, Cueing for compensatory techniques Lower Body Dressing: Moderate assistance, Sit to/from stand, With adaptive equipment, Cueing for compensatory techniques Toilet Transfer: Minimal assistance, Stand-pivot, BSC, RW Toileting- Clothing Manipulation and Hygiene: Moderate assistance, With adaptive equipment, Cueing for compensatory techniques, Sit to/from stand Tub/ Shower Transfer: Minimal assistance, Ambulation, 3 in 1, Rolling walker General ADL Comments: Pt completed SPT from recliner to EOB and completed bed mobility as detailed below. Educated pt and daughter on techniques and AE for completion of ADLs with cervical precautions. Educated pt and daughter on Art gallery manager,  Cognition: Cognition Overall Cognitive Status: Within Functional Limits for tasks assessed Orientation Level: Oriented X4 Cognition Arousal/Alertness: Awake/alert Behavior During Therapy: WFL for tasks assessed/performed Overall Cognitive Status: Within Functional Limits for tasks assessed  Physical Exam: Blood pressure 114/63, pulse 100, temperature 98 F (36.7 C), temperature source Oral, resp. rate 18, height 5' 3.5" (1.613 m), weight 101.6 kg (223 lb 15.8 oz), SpO2 100 %. Physical Exam  Nursing note and vitals reviewed. Constitutional: She is oriented to person, place, and time. She appears well-developed and well-nourished.  Obese female with  tendency to keep neck flexed to the right.  HENT: Throat is clear Head: Normocephalic.  Eyes: Conjunctivae are normal. Pupils are equal, round, and reactive to light.  Neck:   Cervical collar in place. Incision with dry dressing in place  Cardiovascular: Normal rate and regular rhythm.  Respiratory: Effort normal and breath sounds normal.  GI: Soft. Bowel sounds are normal. She exhibits no distension. There is no tenderness.  Musculoskeletal:  Tenderness left and right traps.  Neurological: She is alert and oriented to person, place, and time.  Speech clear and more spontaneous today. She was able to follow basic commands without difficulty.  Numbness left hand and forearm as well as sensory deficits LLE. LUE 3- deltoid, 3- bicep,tricep, 3 hand, BLE: HF 3-, ke +, adf/pf .  Skin: Skin is warm and dry.  Psychiatric: She has a normal mood and affect. Her behavior is normal. Thought content normal.     Lab Results Last 48 Hours    No results found for this or any previous visit (from the past 48 hour(s)).    Imaging Results (Last 48 hours)    No results found.     Medical Problem List and Plan: 1. Functional deficits secondary to cervical stenosis with myelopathy 2. DVT Prophylaxis/Anticoagulation: Mechanical: Sequential compression devices, below knee Bilateral lower extremities 3. Pain Management: Continue vicodin prn. Will add K pad to help with muscle spasms. Will schedule robaxin qid.  4. Mood: LCSW to follow for evaluation and support.  5. Neuropsych: This patient is capable of making decisions on her own behalf. 6. Skin/Wound Care: Routine pressure relief measures.  7. Fluids/Electrolytes/Nutrition: Monitor I/O. Encourage Po fluid intake.  8. Hypokalemia: Noted on pre-op work up. Recheck labs in am. 9. HTN: Montior BP every 8 hours. Continue Norvasc, coreg and avapro.  10. A fib: Was on Xarelto prior to admission. Continue coreg for rate control. Monitor heart rate with increase in activity as noted to have HR up to 105 at rest     Post Admission Physician Evaluation: 1. Functional deficits secondary to cervical stenosis with  Myelopathy.  2. Patient is admitted to receive collaborative, interdisciplinary care between the physiatrist, rehab nursing staff, and therapy team. 3. Patient's level of medical complexity and substantial therapy needs in context of that medical necessity cannot be provided at a lesser intensity of care such as a SNF. 4. Patient has experienced substantial functional loss from his/her baseline which was documented above under the "Functional History" and "Functional Status" headings. Judging by the patient's diagnosis, physical exam, and functional history, the patient has potential for functional progress which will result in measurable gains while on inpatient rehab. These gains will be of substantial and practical use upon discharge in facilitating mobility and self-care at the household level. 5. Physiatrist will provide 24 hour management of medical needs as well as oversight of the therapy plan/treatment and provide guidance as appropriate regarding the interaction of the two. 6. 24 hour rehab nursing will assist with bladder management, bowel management, safety, skin/wound care, disease management, medication administration, pain management and patient education and help integrate therapy concepts, techniques,education, etc. 7. PT will assess and treat for/with: pre gait, gait training, endurance , safety, equipment, neuromuscular re education. Goals are: Sup. 8. OT will assess and treat for/with: ADLs, Cognitive perceptual skills, Neuromuscular re education, safety, endurance, equipment. Goals are: Sup. Therapy May not proceed with showering this patient. 9. SLP will assess and treat for/with: NA. Goals are: NA. 10. Case Management  and Education officer, museum will assess and treat for psychological issues and discharge planning. 11. Team conference will be held weekly to assess progress toward goals and to determine barriers to discharge. 12. Patient will receive at least 3 hours of therapy per  day at least 5 days per week. 13. ELOS: 15-22  14. Prognosis: good     [name] M.D. [facility] FAAPM&R (Sports Med, Neuromuscular Med) Diplomate Am Board of Electrodiagnostic Med  06/12/2014

## 2014-06-12 NOTE — Progress Notes (Signed)
Physical Medicine and Rehabilitation Consult  Reason for Consult: BUE weakness with gait disorder Referring Physician: Dr. Vertell Limber    HPI: Jasmine Hamilton is a 74 y.o. female with history of A fib, DOE, CKD, BUE weakness and numbness with difficulty walking due to severe cervical myelopathy with progressive cervical stenosis and weakness. Work up revealed cervical cord compression with spondylosis and patient elected to undergo C4-C6 cervical decompression with fusion and PEEK cages on 06/09/14 by Dr. Vertell Limber. PT/OT evaluations to be done today.    Review of Systems  HENT: Negative for hearing loss.  Eyes: Negative for blurred vision and double vision.  Respiratory: Negative for cough and shortness of breath.  Cardiovascular: Negative for chest pain and palpitations.  Gastrointestinal: Negative for abdominal pain.  Musculoskeletal: Positive for myalgias, back pain and neck pain.  Neurological: Positive for tingling, sensory change, focal weakness (left sided weakness for past 2 months) and weakness. Negative for dizziness and headaches.      Past Medical History  Diagnosis Date  . A-fib     -newly discovered 12/28/09 - rate controlled, Dr Etter Sjogren  . 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 CKD (chronic kidney disease)   . Allergic rhinitis   . Spinal stenosis   . OA (osteoarthritis) of knee   . Shortness of breath dyspnea     with exertion  . Hypertension   . Hypothyroidism   . Anemia   . Family history of adverse reaction to anesthesia     " my mother had too much anesthesia and went into a coma."  . Dysrhythmia     A-Fib  . Carpal tunnel syndrome, left   . Frequent urination     Past Surgical History  Procedure Laterality Date  . Knee replacement- right knee, dr Maureen Ralphs    . Colonoscopy w/ biopsies and polypectomy       Family History  Problem Relation Age of Onset  . Hypertension Mother   . Diabetes Mother   . Diabetes Brother   . Heart attack Brother   . Kidney disease Sister   . Stroke Mother   . Fainting Father     Social History: Lives alone. Retired [employer]arry manager. Youngest daughter has been assisting for the past 2 weeks and another daughter plans on assisting past discharge. She reports that she has never smoked. She has never used smokeless tobacco. She reports that she does not drink alcohol or use illicit drugs.     Allergies  Allergen Reactions  . Codeine Swelling  . Lotensin [Benazepril Hcl] Cough  . Verelan [Verapamil Hcl Er] Swelling    Medications Prior to Admission  Medication Sig Dispense Refill  . amLODipine (NORVASC) 5 MG tablet Take 5 mg by mouth daily.    . carvedilol (COREG) 25 MG tablet Take 25 mg by mouth daily.    . furosemide (LASIX) 40 MG tablet Take 40 mg by mouth daily as needed for fluid.     . Multiple Vitamin (MULTIVITAMIN) capsule Take 1 capsule by mouth daily.    . traMADol-acetaminophen (ULTRACET) 37.5-325 MG per tablet every 4 (four) hours as needed.     . valsartan (DIOVAN) 320 MG tablet Take 320 mg by mouth daily.    Alveda Reasons 20 MG TABS tablet TAKE 1 TABLET BY MOUTH ONCE DAILY WITH SUPPER 30 tablet 5  . potassium chloride SA (K-DUR,KLOR-CON) 20 MEQ tablet Take 1 tablet by mouth daily as needed (  only take with lasix).       Home: Home Living Family/patient expects to be discharged to:: Private residence Living Arrangements: Children  Functional History:   Functional Status:  Mobility:          ADL:    Cognition: Cognition Orientation Level: Oriented X4    Blood pressure 151/77, pulse 83, temperature 99.6 F (37.6 C), temperature source Oral, resp. rate 17, height 5' 3.5" (1.613 m), weight 101.6 kg (223 lb 15.8 oz), SpO2 99 %. Physical Exam   Nursing note and vitals reviewed. Constitutional: She is oriented to person, place, and time. She appears well-developed and well-nourished.  HENT:  Head: Normocephalic and atraumatic.  Eyes: EOM are normal. Pupils are equal, round, and reactive to light.  Neck:  Neck wound with honeycomb dressing. Cervical collar in place.  Cardiovascular: Regular rhythm. Tachycardia present.  Respiratory: Effort normal and breath sounds normal. She has no wheezes.  GI: Soft. Bowel sounds are normal. She exhibits no distension. There is tenderness.  Musculoskeletal:  Well healed old R-TKR incision. Tends to keep LLE rotated outwards.  Neurological: She is alert and oriented to person, place, and time.  Speech clear but hesitant. She had difficulty finding words to answer basic questions and needed redirection. Numbness left hand and forearm as well as sensory deficits LLE. LUE 2+ deltoid, 3- bicep,tricep, 3 hand, LLE: HF 1+, ke +, adf/pf 1+.  Skin: Skin is warm and dry.  Psychiatric: Her mood appears anxious. Her speech is delayed. She is slowed.     Lab Results Last 24 Hours    No results found for this or any previous visit (from the past 24 hour(s)).    Imaging Results (Last 48 hours)    Dg Cervical Spine 2-3 Views  06/09/2014 CLINICAL DATA: C3 through C7 anterior cervical disc fusion. EXAM: CERVICAL SPINE - 2-3 VIEW COMPARISON: MRI scan of May 31, 2014. FINDINGS: Two intraoperative cross-table portable lateral views of the cervical spine were obtained. The first image demonstrates surgical probes indicating the anterior portions of the C2-3 and C3-4 disc spaces. The second image demonstrates the patient be status post surgical anterior fusion with superior end at C3. Due to overlying soft tissue, the inferior extent of the fusion plate cannot be identified. Only the first 5 cervical vertebra are completely visualized. Interbody spacers are noted as well. IMPRESSION: Surgical  localization as described above for surgical anterior fusion of cervical spine. Electronically Signed By: Marijo Conception, M.D. On: 06/09/2014 12:30     Assessment/Plan: Diagnosis: cervical stenosis with myelopathy 1. Does the need for close, 24 hr/day medical supervision in concert with the patient's rehab needs make it unreasonable for this patient to be served in a less intensive setting? Yes 2. Co-Morbidities requiring supervision/potential complications: ckd, afib, htn 3. Due to bladder management, bowel management, safety, skin/wound care, disease management, medication administration, pain management and patient education, does the patient require 24 hr/day rehab nursing? Yes 4. Does the patient require coordinated care of a physician, rehab nurse, PT (1-2 hrs/day, 5 days/week) and OT (1-2 hrs/day, 5 days/week) to address physical and functional deficits in the context of the above medical diagnosis(es)? Yes Addressing deficits in the following areas: balance, endurance, locomotion, strength, transferring, bowel/bladder control, bathing, dressing, feeding, grooming, toileting and psychosocial support 5. Can the patient actively participate in an intensive therapy program of at least 3 hrs of therapy per day at least 5 days per week? Yes 6. The potential for patient to make measurable  gains while on inpatient rehab is good 7. Anticipated functional outcomes upon discharge from inpatient rehab are supervision and min assist with PT, supervision and min assist with OT, n/a with SLP. 8. Estimated rehab length of stay to reach the above functional goals is: potentially 15-22 days 9. Does the patient have adequate social supports and living environment to accommodate these discharge functional goals? Yes and Potentially 10. Anticipated D/C setting: Home 11. Anticipated post D/C treatments: [place] therapy 12. Overall Rehab/Functional Prognosis: excellent  RECOMMENDATIONS: This patient's  condition is appropriate for continued rehabilitative care in the following setting: CIR Patient has agreed to participate in recommended program. Yes Note that insurance prior authorization may be required for reimbursement for recommended care.  Comment: Need to identify social supports. Rehab Admissions Coordinator to follow up.  Thanks,  Meredith Staggers, MD, Mellody Drown     06/10/2014       Revision History     Date/Time User Provider Type Action   06/10/2014 4:25 PM Meredith Staggers, MD Physician Sign   06/10/2014 12:57 PM Bary Leriche, PA-C Physician Assistant Share   View Details Report       Routing History     Date/Time From To Method   06/10/2014 4:25 PM Meredith Staggers, MD Meredith Staggers, MD In Basket   06/10/2014 4:25 PM Meredith Staggers, MD Harlan Stains, MD Fax

## 2014-06-12 NOTE — Discharge Summary (Signed)
Physician Discharge Summary  Patient ID: Jasmine Hamilton MRN: [id number] DOB/AGE: 07-08-1940 74 y.o.  Admit date: 06/09/2014 Discharge date: 06/12/2014  Admission Diagnoses: Cervical cord compression with kyphotic deformity, spondylosis with myelopathy and radiculopathy C 34, C 45, C 56, C 67 levels   Discharge Diagnoses: Cervical cord compression with kyphotic deformity, spondylosis with myelopathy and radiculopathy C 34, C 45, C 56, C 67 levels s/p CERVICAL THREE-FOUR,CERVICAL FOUR-FIVE,CERVICAL FIVE-SIX,CERVICAL SIX-SEVEN,ANTERIOR CERVIAL DECOMPRESSION/DISCECTOMY/FUSION (N/A) with PEEK cages, autograft, allograft, anterior cervical plate  Active Problems:   Stenosis of cervical spine with myelopathy   Discharged Condition: good  Hospital Course: Jasmine Hamilton was admitted for surgery with cervical cord compression and myelopathy.  Following uncomplicated ACDF A1-2 levels, she recovered well in Neuro PACU. Her progress has been slow as expected, but steady and without adverse events.  Consults: rehabilitation medicine  Significant Diagnostic Studies: radiology: X-Ray: intra-operative  Treatments: surgery: CERVICAL THREE-FOUR,CERVICAL FOUR-FIVE,CERVICAL FIVE-SIX,CERVICAL SIX-SEVEN,ANTERIOR CERVIAL DECOMPRESSION/DISCECTOMY/FUSION (N/A) with PEEK cages, autograft, allograft, anterior cervical plate   Discharge Exam: Blood pressure 151/84, pulse 105, temperature 98.4 F (36.9 C), temperature source Oral, resp. rate 18, height 5' 3.5" (1.613 m), weight 101.6 kg (223 lb 15.8 oz), SpO2 98 %. Alert, smiling, conversant. [place] Collar in use. Incision flat, without erythema or drainage. 2x2 at drain site with minimal drainage. Strength improving, able to lift LLE off bed today with some effort, but improved. LUE strength improving as well. Pain controlled on po meds.   Disposition: Discharge to [place]. Office follow up with Dr. Vertell Limber in 3-4 weeks. Pt aware of plan and  agreeable.      Medication List    STOP taking these medications        XARELTO 20 MG Tabs tablet  Generic drug:  rivaroxaban      TAKE these medications        amLODipine 5 MG tablet  Commonly known as:  NORVASC  Take 5 mg by mouth daily.     carvedilol 25 MG tablet  Commonly known as:  COREG  Take 25 mg by mouth daily.     furosemide 40 MG tablet  Commonly known as:  LASIX  Take 40 mg by mouth daily as needed for fluid.     multivitamin capsule  Take 1 capsule by mouth daily.     potassium chloride SA 20 MEQ tablet  Commonly known as:  K-DUR,KLOR-CON  Take 1 tablet by mouth daily as needed (only take with lasix).     traMADol-acetaminophen 37.5-325 MG per tablet  Commonly known as:  ULTRACET  every 4 (four) hours as needed.     valsartan 320 MG tablet  Commonly known as:  DIOVAN  Take 320 mg by mouth daily.           Follow-up Information    Follow up with Peggyann Shoals, MD.   Specialty:  Neurosurgery   Contact information:   [address] [place] [address] [phone]       Signed: Verdis Prime 06/12/2014, 7:39 AM

## 2014-06-12 NOTE — Progress Notes (Signed)
Report given to 68M nurse. All questions were answered.

## 2014-06-12 NOTE — PMR Pre-admission (Signed)
PMR Admission Coordinator Pre-Admission Assessment  Patient: Jasmine Hamilton is an 74 y.o., female MRN: [id number] DOB: [birth year] Height: 5' 3.5" (161.3 cm) Weight: 101.6 kg (223 lb 15.8 oz)              Insurance Information HMO: yes    PPO:      PCP:      IPA:      80/20:      OTHER: Medicare advantage plan PRIMARY: A[employer] Medicare       Policy#: [id number]      Subscriber: pt CM Name: Katrina      Phone#: [phone] ext [phone]     Fax#: [phone] Pre-Cert#: TBA approved for 7 days. Colbert Coyer to follow on CIR [phone]      Employer: retired Benefits:  Phone #: [phone]     Name: 06/11/14 Eff. Date: 02/14/14     Deduct: none      Out of Pocket Max: $4900      Life Max: none CIR: $345 copay per day days 1-5      SNF: no copay days 1-20; $160 copay per day days 21-51; no copay days 52-100 Outpatient: $40 copay per visit     Co-Pay: no visit limit Home Health: 100%      Co-Pay: no visit limit DME: 80%     Co-Pay: 20% Providers: in network  SECONDARY: none       Medicaid Application Date:       Case Manager:  Disability Application Date:       Case Worker:   Emergency Contact Information Contact Information    Name Relation Home Work Mobile   [place] Daughter   [phone]   Adelaida, Reindel Daughter [phone]     Nykira, Reddix [phone]  [phone]     Current Medical History  Patient Admitting Diagnosis: cervical stenosis with myelopathy  History of Present Illness: 74 y.o. female with history of A fib, DOE, CKD, BUE weakness and numbness with difficulty walking due to severe cervical myelopathy with progressive cervical stenosis and weakness. Work up revealed cervical cord compression with spondylosis and patient elected to undergo C4-C6 cervical decompression with fusion and PEEK cages on 06/09/14 by Dr. Vertell Limber. Drain removed 4/27.   Past Medical History  Past Medical History  Diagnosis Date  . A-fib     -newly discovered 12/28/09 - rate  controlled, Dr Etter Sjogren  . 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 CKD (chronic kidney disease)   . Allergic rhinitis   . Spinal stenosis   . OA (osteoarthritis) of knee   . Shortness of breath dyspnea     with exertion  . Hypertension   . Hypothyroidism   . Anemia   . Family history of adverse reaction to anesthesia     " my mother had too much anesthesia and went into a coma."  . Dysrhythmia     A-Fib  . Carpal tunnel syndrome, left   . Frequent urination     Family History  family history includes Diabetes in her brother and mother; Fainting in her father; Heart attack in her brother; Hypertension in her mother; Kidney disease in her sister; Stroke in her mother.  Prior Rehab/Hospitalizations: [facility] 2001 after TKR   Current Medications   Current facility-administered medications:  .  0.9 %  sodium chloride infusion, 250 mL, Intravenous, Continuous, Erline Levine, MD, 250 mL at 06/09/14 1435 .  acetaminophen (TYLENOL) tablet 650  mg, 650 mg, Oral, Q4H PRN **OR** acetaminophen (TYLENOL) suppository 650 mg, 650 mg, Rectal, Q4H PRN, Erline Levine, MD .  alum & mag hydroxide-simeth (MAALOX/MYLANTA) 200-200-20 MG/5ML suspension 30 mL, 30 mL, Oral, Q6H PRN, Erline Levine, MD .  amLODipine (NORVASC) tablet 5 mg, 5 mg, Oral, Daily, Erline Levine, MD, 5 mg at 06/12/14 0805 .  bisacodyl (DULCOLAX) suppository 10 mg, 10 mg, Rectal, Daily PRN, Erline Levine, MD .  carvedilol (COREG) tablet 25 mg, 25 mg, Oral, Daily, Erline Levine, MD, 25 mg at 06/12/14 0805 .  dextrose 5 % and 0.45 % NaCl with KCl 20 mEq/L infusion, , Intravenous, Continuous, Erline Levine, MD, Last Rate: 75 mL/hr at 06/09/14 1434 .  docusate sodium (COLACE) capsule 100 mg, 100 mg, Oral, BID, Erline Levine, MD, 100 mg at 06/12/14 0805 .  feeding supplement (ENSURE ENLIVE) (ENSURE ENLIVE) liquid 237 mL, 237 mL, Oral, BID BM, Erline Levine, MD, 237 mL at 06/12/14 1000 .   HYDROcodone-acetaminophen (NORCO/VICODIN) 5-325 MG per tablet 1-2 tablet, 1-2 tablet, Oral, Q4H PRN, Erline Levine, MD, 2 tablet at 06/11/14 2041 .  HYDROmorphone (DILAUDID) injection 0.5-1 mg, 0.5-1 mg, Intravenous, Q2H PRN, Erline Levine, MD, 1 mg at 06/11/14 1325 .  irbesartan (AVAPRO) tablet 300 mg, 300 mg, Oral, Daily, Erline Levine, MD, 300 mg at 06/12/14 0805 .  menthol-cetylpyridinium (CEPACOL) lozenge 3 mg, 1 lozenge, Oral, PRN, 3 mg at 06/09/14 1436 **OR** phenol (CHLORASEPTIC) mouth spray 1 spray, 1 spray, Mouth/Throat, PRN, Erline Levine, MD .  methocarbamol (ROBAXIN) tablet 500 mg, 500 mg, Oral, Q6H PRN, 500 mg at 06/12/14 0805 **OR** methocarbamol (ROBAXIN) 500 mg in dextrose 5 % 50 mL IVPB, 500 mg, Intravenous, Q6H PRN, Erline Levine, MD .  ondansetron [facility]) injection 4 mg, 4 mg, Intravenous, Q4H PRN, Erline Levine, MD, 4 mg at 06/09/14 1505 .  oxyCODONE-acetaminophen (PERCOCET/ROXICET) 5-325 MG per tablet 1-2 tablet, 1-2 tablet, Oral, Q4H PRN, Erline Levine, MD .  pantoprazole (PROTONIX) EC tablet 40 mg, 40 mg, Oral, QHS, Erline Levine, MD, 40 mg at 06/11/14 2231 .  polyethylene glycol (MIRALAX / GLYCOLAX) packet 17 g, 17 g, Oral, Daily PRN, Erline Levine, MD .  potassium chloride SA (K-DUR,KLOR-CON) CR tablet 20 mEq, 20 mEq, Oral, Daily PRN, Erline Levine, MD .  sodium chloride 0.9 % injection 3 mL, 3 mL, Intravenous, Q12H, Erline Levine, MD, 3 mL at 06/11/14 2231 .  sodium chloride 0.9 % injection 3 mL, 3 mL, Intravenous, PRN, Erline Levine, MD .  traMADol-acetaminophen (ULTRACET) 37.5-325 MG per tablet 1 tablet, 1 tablet, Oral, Q4H PRN, Erline Levine, MD, 1 tablet at 06/09/14 1836  Patients Current Diet: Diet Heart Room service appropriate?: Yes; Fluid consistency:: Thin  Precautions / Restrictions Precautions Precautions: Cervical, Fall Cervical Brace: Hard collar, At all times Restrictions Weight Bearing Restrictions: No   Prior Activity Level Limited Community (1-2x/wk): decline  in fucntion for past 3 months; priot to that Mod I with cane and driving. Gradual decline over past 3 months. Needed assistance with adls over past two weeks with dtr, Kenney Houseman assisting. Was Mod I with straight cane and driving prior to Jan. She is caregiver for her 16 yo autistic son, Mortimer Fries. He does have CAPS assistance  Development worker, international aid / [place] Devices/Equipment: Wheelchair, Environmental consultant (specify type), Eyeglasses, Dentures (specify type), Grab bars in shower, Hand-held shower hose  Prior Functional Level Prior Function Level of Independence: Needs assistance Gait / Transfers Assistance Needed: Uses Standard Walker for ambulation.   ADL's / Homemaking  Assistance Needed: Nicole Kindred, dtr was assisting with adls over past 2 to 3 weeks due to decline. Overall decline for 3 months Comments: Pt was Mod I with cane prior to 1/16. Adult son, Mortimer Fries, lives with her. Autistic/mute  Current Functional Level Cognition  Overall Cognitive Status: Within Functional Limits for tasks assessed Orientation Level: Oriented X4    Extremity Assessment (includes Sensation/Coordination)  Upper Extremity Assessment: Generalized weakness, RUE deficits/detail, LUE deficits/detail RUE Deficits / Details: numbness/tingling in all digits; RUE grossly stronger than LUE RUE Sensation: decreased light touch LUE Deficits / Details: pain with shoulder flexion/abduction ~40 degrees; numbness/tingling throughout LUE LUE Sensation: decreased light touch LUE Coordination: decreased fine motor  Lower Extremity Assessment: Defer to PT evaluation    ADLs  Overall ADL's : Needs assistance/impaired Eating/Feeding: Set up, Sitting Grooming: Set up, Sitting, Cueing for compensatory techniques Upper Body Bathing: Minimal assitance, Sitting, Cueing for compensatory techniques Lower Body Bathing: Moderate assistance, Sit to/from stand, Cueing for compensatory techniques, With adaptive equipment Upper Body  Dressing : Set up, Sitting, Cueing for compensatory techniques Lower Body Dressing: Moderate assistance, Sit to/from stand, With adaptive equipment, Cueing for compensatory techniques Toilet Transfer: Minimal assistance, Stand-pivot, BSC, RW Toileting- Clothing Manipulation and Hygiene: Moderate assistance, With adaptive equipment, Cueing for compensatory techniques, Sit to/from stand Tub/ Shower Transfer: Minimal assistance, Ambulation, 3 in 1, Rolling walker General ADL Comments: Pt completed SPT from recliner to EOB and completed bed mobility as detailed below. Educated pt and daughter on techniques and AE for completion of ADLs with cervical precautions. Educated pt and daughter on collar management,    Mobility  Overal bed mobility: Needs Assistance Bed Mobility: Sit to Sidelying Rolling: Min assist Sidelying to sit: Min assist, HOB elevated Sit to sidelying: Mod assist General bed mobility comments: A with Bil LEs to return to bed.      Transfers  Overall transfer level: Needs assistance Equipment used: Rolling walker (2 wheeled) Transfers: Sit to/from Stand Sit to Stand: Min assist Stand pivot transfers: Min assist General transfer comment: cues for reaching back to bed prior to coming to sit.      Ambulation / Gait / Stairs / Wheelchair Mobility  Ambulation/Gait Ambulation/Gait assistance: Museum/gallery curator (Feet): 30 Feet (x2) Assistive device: Rolling walker (2 wheeled) Gait Pattern/deviations: Step-through pattern, Decreased stride length, Trunk flexed General Gait Details: pt continues to lean heavily on RW and needs cues for more upright psoture and positioning within RW.      Posture / Balance Balance Overall balance assessment: Needs assistance Sitting-balance support: Feet supported, Single extremity supported Sitting balance-Leahy Scale: Poor Standing balance support: Bilateral upper extremity supported, During functional activity Standing  balance-Leahy Scale: Poor    Special needs/care consideration Skin surgical site Bowel mgmt: BM 4/15using stool softner Bladder mgmt: continent but does have urgency with some incontinence pta    Previous Home Environment Living Arrangements: Other (Comment) (Pt's 74 yo old autistic son, Mortimer Fries, lives with her. He h)  Lives With: Son Available Help at Discharge: Available 24 hours/day (Dtr, Viola, to take FMLA for pt to d/c home with 24/7 super) Type of Home: House Home Layout: One level Home Access: Stairs to enter Entrance Stairs-Rails: Right, Left, Can reach both Entrance Stairs-Number of Steps: 6 Bathroom Shower/Tub: Tub/shower unit, Architectural technologist: Standard Bathroom Accessibility: Yes How Accessible: Accessible via walker [place]: No Son has CAPS assistance  Discharge Living Setting Plans for Discharge Living Setting: Patient's home, Other (Comment) (pt's 74 yo autistic/mute son,  Mortimer Fries, lives with pt) Type of Home at Discharge: House Discharge Home Layout: One level Discharge Home Access: Stairs to enter Entrance Stairs-Rails: Right, Left, Can reach both Discharge Bathroom Shower/Tub: Tub/shower unit, Curtain Discharge Bathroom Toilet: Standard Discharge Bathroom Accessibility: Yes How Accessible: Accessible via walker Does the patient have any problems obtaining your medications?: No  Social/Family/Support Systems Patient Roles: Spouse, Parent, Caregiver (caregiver to her adult handicapped son, Mortimer Fries) Sport and exercise psychologist Information: Newman Nickels, dtr main contact Anticipated Caregiver: Estill Batten to take FMLA but other sister Kenney Houseman, to also assist Anticipated Caregiver's Contact Information: see above Ability/Limitations of Caregiver: Alecia taking FMLA. She lives in [place] but works in [place] She will take intermittent FMLA whil pt at [employer] then take fulltime FMLA once d/c'd Caregiver Availability: 24/7 Discharge Plan Discussed with Primary Caregiver:  Yes Is Caregiver In Agreement with Plan?: Yes Does Caregiver/Family have Issues with Lodging/Transportation while Pt is in Rehab?: No (Alecia has been staying with pt 24/7 in hospital but plans t)  Goals/Additional Needs Patient/Family Goal for Rehab: Supervision to min assist PT and OT Expected length of stay: ELOS 15-22 days Pt/Family Agrees to Admission and willing to participate: Yes Program Orientation Provided & Reviewed with Pt/Caregiver Including Roles  & Responsibilities: Yes  Decrease burden of Care through IP rehab admission: n/a  Possible need for SNF placement upon discharge: not anticipated  Patient Condition: This patient's medical and functional status has changed since the consult dated: 06/10/2014 in which the Rehabilitation Physician determined and documented that the patient's condition is appropriate for intensive rehabilitative care in an inpatient rehabilitation facility. See "History of Present Illness" (above) for medical update. Functional changes are: overall min to mod assist. Patient's medical and functional status update has been discussed with the Rehabilitation physician and patient remains appropriate for inpatient rehabilitation. Will admit to inpatient rehab today.  Preadmission Screen Completed By:  Cleatrice Burke, 06/12/2014 11:07 AM ______________________________________________________________________   Discussed status with Dr. Letta Pate on 06/12/2014 at  1107 and received telephone approval for admission today.  Admission Coordinator:  Cleatrice Burke, time 0865 Date 06/13/2014.

## 2014-06-12 NOTE — Progress Notes (Signed)
Cristina Gong, RN Rehab Admission Coordinator Signed Physical Medicine and Rehabilitation PMR Pre-admission 06/12/2014 10:57 AM  Related encounter: Admission (Current) from 06/09/2014 in [facility]   PMR Admission Coordinator Pre-Admission Assessment  Patient: Jasmine Hamilton is an 74 y.o., female MRN: [id number] DOB: 05-18-1940 Height: 5' 3.5" (161.3 cm) Weight: 101.6 kg (223 lb 15.8 oz)  Insurance Information HMO: yes PPO: PCP: IPA: 80/20: OTHER: Medicare advantage plan PRIMARY: A[employer] Medicare Policy#: [id number] Subscriber: pt CM Name: Katrina Phone#: [phone] ext [phone] Fax#: [phone] Pre-Cert#: TBA approved for 7 days. Colbert Coyer to follow on CIR [phone] Employer: retired Benefits: Phone #: [phone] Name: 06/11/14 Eff. Date: 02/14/14 Deduct: none Out of Pocket Max: $4900 Life Max: none CIR: $345 copay per day days 1-5 SNF: no copay days 1-20; $160 copay per day days 21-51; no copay days 52-100 Outpatient: $40 copay per visit Co-Pay: no visit limit Home Health: 100% Co-Pay: no visit limit DME: 80% Co-Pay: 20% Providers: in network  SECONDARY: none  Medicaid Application Date: Case Manager:  Disability Application Date: Case Worker:   Emergency Contact Information Contact Information    Name Relation Home Work Mobile   [place] Daughter   [phone]   Ronnette, R[place] Daughter [phone]     [place], [place] [phone]  [phone]     Current Medical History  Patient Admitting Diagnosis: cervical stenosis with myelopathy  History of Present Illness: 74 y.o. female with history of A fib, DOE, CKD, BUE weakness and  numbness with difficulty walking due to severe cervical myelopathy with progressive cervical stenosis and weakness. Work up revealed cervical cord compression with spondylosis and patient elected to undergo C4-C6 cervical decompression with fusion and PEEK cages on 06/09/14 by Dr. Vertell Limber. Drain removed 4/27.   Past Medical History  Past Medical History  Diagnosis Date  . A-fib     -newly discovered 12/28/09 - rate controlled, Dr Etter Sjogren  . 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 CKD (chronic kidney disease)   . Allergic rhinitis   . Spinal stenosis   . OA (osteoarthritis) of knee   . Shortness of breath dyspnea     with exertion  . Hypertension   . Hypothyroidism   . Anemia   . Family history of adverse reaction to anesthesia     " my mother had too much anesthesia and went into a coma."  . Dysrhythmia     A-Fib  . Carpal tunnel syndrome, left   . Frequent urination     Family History  family history includes Diabetes in her brother and mother; Fainting in her father; Heart attack in her brother; Hypertension in her mother; Kidney disease in her sister; Stroke in her mother.  Prior Rehab/Hospitalizations: [facility] 2001 after TKR  Current Medications   Current facility-administered medications:  . 0.9 % sodium chloride infusion, 250 mL, Intravenous, Continuous, Erline Levine, MD, 250 mL at 06/09/14 1435 . acetaminophen (TYLENOL) tablet 650 mg, 650 mg, Oral, Q4H PRN **OR** acetaminophen (TYLENOL) suppository 650 mg, 650 mg, Rectal, Q4H PRN, Erline Levine, MD . alum & mag hydroxide-simeth (MAALOX/MYLANTA) 200-200-20 MG/5ML suspension 30 mL, 30 mL, Oral, Q6H PRN, Erline Levine, MD . amLODipine (NORVASC) tablet 5 mg, 5 mg, Oral, Daily, Erline Levine, MD, 5 mg at 06/12/14 0805 . bisacodyl (DULCOLAX) suppository 10 mg, 10 mg, Rectal, Daily PRN, Erline Levine, MD . carvedilol (COREG) tablet 25  mg, 25 mg, Oral, Daily, Erline Levine, MD, 25 mg at 06/12/14 0805 . dextrose 5 % and 0.45 % NaCl with KCl 20 mEq/L infusion, , Intravenous, Continuous, Erline Levine, MD, Last Rate: 75 mL/hr at 06/09/14 1434 . docusate sodium (COLACE) capsule 100 mg, 100 mg, Oral, BID, Erline Levine, MD, 100 mg at 06/12/14 0805 . feeding supplement (ENSURE ENLIVE) (ENSURE ENLIVE) liquid 237 mL, 237 mL, Oral, BID BM, Erline Levine, MD, 237 mL at 06/12/14 1000 . HYDROcodone-acetaminophen (NORCO/VICODIN) 5-325 MG per tablet 1-2 tablet, 1-2 tablet, Oral, Q4H PRN, Erline Levine, MD, 2 tablet at 06/11/14 2041 . HYDROmorphone (DILAUDID) injection 0.5-1 mg, 0.5-1 mg, Intravenous, Q2H PRN, Erline Levine, MD, 1 mg at 06/11/14 1325 . irbesartan (AVAPRO) tablet 300 mg, 300 mg, Oral, Daily, Erline Levine, MD, 300 mg at 06/12/14 0805 . menthol-cetylpyridinium (CEPACOL) lozenge 3 mg, 1 lozenge, Oral, PRN, 3 mg at 06/09/14 1436 **OR** phenol (CHLORASEPTIC) mouth spray 1 spray, 1 spray, Mouth/Throat, PRN, Erline Levine, MD . methocarbamol (ROBAXIN) tablet 500 mg, 500 mg, Oral, Q6H PRN, 500 mg at 06/12/14 0805 **OR** methocarbamol (ROBAXIN) 500 mg in dextrose 5 % 50 mL IVPB, 500 mg, Intravenous, Q6H PRN, Erline Levine, MD . ondansetron [facility]) injection 4 mg, 4 mg, Intravenous, Q4H PRN, Erline Levine, MD, 4 mg at 06/09/14 1505 . oxyCODONE-acetaminophen (PERCOCET/ROXICET) 5-325 MG per tablet 1-2 tablet, 1-2 tablet, Oral, Q4H PRN, Erline Levine, MD . pantoprazole (PROTONIX) EC tablet 40 mg, 40 mg, Oral, QHS, Erline Levine, MD, 40 mg at 06/11/14 2231 . polyethylene glycol (MIRALAX / GLYCOLAX) packet 17 g, 17 g, Oral, Daily PRN, Erline Levine, MD . potassium chloride SA (K-DUR,KLOR-CON) CR tablet 20 mEq, 20 mEq, Oral, Daily PRN, Erline Levine, MD . sodium chloride 0.9 % injection 3 mL, 3 mL, Intravenous, Q12H, Erline Levine, MD, 3 mL at 06/11/14 2231 . sodium chloride 0.9 % injection 3 mL, 3 mL,  Intravenous, PRN, Erline Levine, MD . traMADol-acetaminophen (ULTRACET) 37.5-325 MG per tablet 1 tablet, 1 tablet, Oral, Q4H PRN, Erline Levine, MD, 1 tablet at 06/09/14 1836  Patients Current Diet: Diet Heart Room service appropriate?: Yes; Fluid consistency:: Thin  Precautions / Restrictions Precautions Precautions: Cervical, Fall Cervical Brace: Hard collar, At all times Restrictions Weight Bearing Restrictions: No   Prior Activity Level Limited Community (1-2x/wk): decline in fucntion for past 3 months; priot to that Mod I with cane and driving. Gradual decline over past 3 months. Needed assistance with adls over past two weeks with dtr, Kenney Houseman assisting. Was Mod I with straight cane and driving prior to Jan. She is caregiver for her 33 yo autistic son, Mortimer Fries. He does have CAPS assistance  Development worker, international aid / [place] Devices/Equipment: Wheelchair, Environmental consultant (specify type), Eyeglasses, Dentures (specify type), Grab bars in shower, Hand-held shower hose  Prior Functional Level Prior Function Level of Independence: Needs assistance Gait / Transfers Assistance Needed: Uses Standard Walker for ambulation.  ADL's / Homemaking Assistance Needed: Nicole Kindred, dtr was assisting with adls over past 2 to 3 weeks due to decline. Overall decline for 3 months Comments: Pt was Mod I with cane prior to 1/16. Adult son, Mortimer Fries, lives with her. Autistic/mute  Current Functional Level Cognition  Overall Cognitive Status: Within Functional Limits for tasks assessed Orientation Level: Oriented X4   Extremity Assessment (includes Sensation/Coordination)  Upper Extremity Assessment: Generalized weakness, RUE deficits/detail, LUE deficits/detail RUE Deficits / Details: numbness/tingling in all digits; RUE grossly stronger than LUE RUE Sensation: decreased light touch LUE Deficits / Details: pain with  shoulder flexion/abduction ~40 degrees; numbness/tingling throughout LUE LUE  Sensation: decreased light touch LUE Coordination: decreased fine motor  Lower Extremity Assessment: Defer to PT evaluation    ADLs  Overall ADL's : Needs assistance/impaired Eating/Feeding: Set up, Sitting Grooming: Set up, Sitting, Cueing for compensatory techniques Upper Body Bathing: Minimal assitance, Sitting, Cueing for compensatory techniques Lower Body Bathing: Moderate assistance, Sit to/from stand, Cueing for compensatory techniques, With adaptive equipment Upper Body Dressing : Set up, Sitting, Cueing for compensatory techniques Lower Body Dressing: Moderate assistance, Sit to/from stand, With adaptive equipment, Cueing for compensatory techniques Toilet Transfer: Minimal assistance, Stand-pivot, BSC, RW Toileting- Clothing Manipulation and Hygiene: Moderate assistance, With adaptive equipment, Cueing for compensatory techniques, Sit to/from stand Tub/ Shower Transfer: Minimal assistance, Ambulation, 3 in 1, Rolling walker General ADL Comments: Pt completed SPT from recliner to EOB and completed bed mobility as detailed below. Educated pt and daughter on techniques and AE for completion of ADLs with cervical precautions. Educated pt and daughter on collar management,    Mobility  Overal bed mobility: Needs Assistance Bed Mobility: Sit to Sidelying Rolling: Min assist Sidelying to sit: Min assist, HOB elevated Sit to sidelying: Mod assist General bed mobility comments: A with Bil LEs to return to bed.     Transfers  Overall transfer level: Needs assistance Equipment used: Rolling walker (2 wheeled) Transfers: Sit to/from Stand Sit to Stand: Min assist Stand pivot transfers: Min assist General transfer comment: cues for reaching back to bed prior to coming to sit.     Ambulation / Gait / Stairs / Wheelchair Mobility  Ambulation/Gait Ambulation/Gait assistance: Museum/gallery curator (Feet): 30 Feet (x2) Assistive device: Rolling walker (2  wheeled) Gait Pattern/deviations: Step-through pattern, Decreased stride length, Trunk flexed General Gait Details: pt continues to lean heavily on RW and needs cues for more upright psoture and positioning within RW.     Posture / Balance Balance Overall balance assessment: Needs assistance Sitting-balance support: Feet supported, Single extremity supported Sitting balance-Leahy Scale: Poor Standing balance support: Bilateral upper extremity supported, During functional activity Standing balance-Leahy Scale: Poor    Special needs/care consideration Skin surgical site Bowel mgmt: BM 4/15using stool softner Bladder mgmt: continent but does have urgency with some incontinence pta    Previous Home Environment Living Arrangements: Other (Comment) (Pt's 74 yo old autistic son, Mortimer Fries, lives with her. He h) Lives With: Son Available Help at Discharge: Available 24 hours/day (Dtr, [place], to take FMLA for pt to d/c home with 24/7 super) Type of Home: House Home Layout: One level Home Access: Stairs to enter Entrance Stairs-Rails: Right, Left, Can reach both Entrance Stairs-Number of Steps: 6 Bathroom Shower/Tub: Tub/shower unit, Industrial/product designer: Yes How Accessible: Accessible via walker [place]: No Son has CAPS assistance  Discharge Living Setting Plans for Discharge Living Setting: Patient's home, Other (Comment) (pt's 73 yo autistic/mute son, Mortimer Fries, lives with pt) Type of Home at Discharge: House Discharge Home Layout: One level Discharge Home Access: Stairs to enter Entrance Stairs-Rails: Right, Left, Can reach both Discharge Bathroom Shower/Tub: Tub/shower unit, Curtain Discharge Bathroom Toilet: Standard Discharge Bathroom Accessibility: Yes How Accessible: Accessible via walker Does the patient have any problems obtaining your medications?: No  Social/Family/Support Systems Patient Roles: Spouse, Parent,  Caregiver (caregiver to her adult handicapped son, Mortimer Fries) Sport and exercise psychologist Information: Newman Nickels, dtr main contact Anticipated Caregiver: Estill Batten to take FMLA but other sister Kenney Houseman, to also assist Anticipated Caregiver's Contact Information: see above Ability/Limitations of Caregiver: Estill Batten  taking FMLA. She lives in [place] but works in [place] She will take intermittent FMLA whil pt at [employer] then take fulltime FMLA once d/c'd Caregiver Availability: 24/7 Discharge Plan Discussed with Primary Caregiver: Yes Is Caregiver In Agreement with Plan?: Yes Does Caregiver/Family have Issues with Lodging/Transportation while Pt is in Rehab?: No (Alecia has been staying with pt 24/7 in hospital but plans t)  Goals/Additional Needs Patient/Family Goal for Rehab: Supervision to min assist PT and OT Expected length of stay: ELOS 15-22 days Pt/Family Agrees to Admission and willing to participate: Yes Program Orientation Provided & Reviewed with Pt/Caregiver Including Roles & Responsibilities: Yes  Decrease burden of Care through IP rehab admission: n/a  Possible need for SNF placement upon discharge: not anticipated  Patient Condition: This patient's medical and functional status has changed since the consult dated: 06/10/2014 in which the Rehabilitation Physician determined and documented that the patient's condition is appropriate for intensive rehabilitative care in an inpatient rehabilitation facility. See "History of Present Illness" (above) for medical update. Functional changes are: overall min to mod assist. Patient's medical and functional status update has been discussed with the Rehabilitation physician and patient remains appropriate for inpatient rehabilitation. Will admit to inpatient rehab today.  Preadmission Screen Completed By: Cleatrice Burke, 06/12/2014 11:07 AM ______________________________________________________________________  Discussed status with Dr. Letta Pate on 06/12/2014  at 1107 and received telephone approval for admission today.  Admission Coordinator: Cleatrice Burke, time [id number] Date 06/13/2014.          Cosigned by: [name], MD at 06/12/2014 11:16 AM  Revision History     Date/Time User Provider Type Action   06/12/2014 11:16 AM [name], MD Physician Cosign   06/12/2014 11:08 AM Cristina Gong, RN Rehab Admission Coordinator Sign   06/12/2014 11:07 AM Cristina Gong, RN Rehab Admission Coordinator Sign   View Details Report

## 2014-06-12 NOTE — Progress Notes (Signed)
Report given by Meredeth Ide, RN. Patient arrived onto unit via wheelchair. Given admission information packet, and discussed safety plan and agreement, patient verbalized understanding. Also discussed therapy schedule, and answered all questions. Patient's daughter at bedside. Will continue to monitor patient.[place]

## 2014-06-13 ENCOUNTER — Inpatient Hospital Stay (HOSPITAL_COMMUNITY): Payer: Medicare Other | Admitting: Occupational Therapy

## 2014-06-13 ENCOUNTER — Inpatient Hospital Stay (HOSPITAL_COMMUNITY): Payer: Medicare Other

## 2014-06-13 DIAGNOSIS — I482 Chronic atrial fibrillation: Secondary | ICD-10-CM

## 2014-06-13 DIAGNOSIS — G959 Disease of spinal cord, unspecified: Secondary | ICD-10-CM

## 2014-06-13 LAB — CBC WITH DIFFERENTIAL/PLATELET
Basophils Absolute: 0 10*3/uL (ref 0.0–0.1)
Basophils Relative: 0 % (ref 0–1)
EOS PCT: 1 % (ref 0–5)
Eosinophils Absolute: 0 10*3/uL (ref 0.0–0.7)
HCT: 40.2 % (ref 36.0–46.0)
Hemoglobin: 13.4 g/dL (ref 12.0–15.0)
Lymphocytes Relative: 28 % (ref 12–46)
Lymphs Abs: 1.5 10*3/uL (ref 0.7–4.0)
MCH: 30 pg (ref 26.0–34.0)
MCHC: 33.3 g/dL (ref 30.0–36.0)
MCV: 90.1 fL (ref 78.0–100.0)
MONO ABS: 0.5 10*3/uL (ref 0.1–1.0)
MONOS PCT: 8 % (ref 3–12)
NEUTROS PCT: 63 % (ref 43–77)
Neutro Abs: 3.4 10*3/uL (ref 1.7–7.7)
Platelets: 184 10*3/uL (ref 150–400)
RBC: 4.46 MIL/uL (ref 3.87–5.11)
RDW: 13.3 % (ref 11.5–15.5)
WBC: 5.4 10*3/uL (ref 4.0–10.5)

## 2014-06-13 LAB — COMPREHENSIVE METABOLIC PANEL
ALK PHOS: 57 U/L (ref 39–117)
ALT: 17 U/L (ref 0–35)
AST: 19 U/L (ref 0–37)
Albumin: 3.1 g/dL — ABNORMAL LOW (ref 3.5–5.2)
Anion gap: 9 (ref 5–15)
BILIRUBIN TOTAL: 1 mg/dL (ref 0.3–1.2)
BUN: 16 mg/dL (ref 6–23)
CALCIUM: 9 mg/dL (ref 8.4–10.5)
CO2: 30 mmol/L (ref 19–32)
CREATININE: 0.87 mg/dL (ref 0.50–1.10)
Chloride: 100 mmol/L (ref 96–112)
GFR calc non Af Amer: 65 mL/min — ABNORMAL LOW (ref >90)
GFR, EST AFRICAN AMERICAN: 75 mL/min — AB (ref >90)
GLUCOSE: 128 mg/dL — AB (ref 70–99)
POTASSIUM: 3.1 mmol/L — AB (ref 3.5–5.1)
Sodium: 139 mmol/L (ref 135–145)
Total Protein: 6 g/dL (ref 6.0–8.3)

## 2014-06-13 MED ORDER — POTASSIUM CHLORIDE CRYS ER 20 MEQ PO TBCR
20.0000 meq | EXTENDED_RELEASE_TABLET | Freq: Two times a day (BID) | ORAL | Status: DC
  Administered 2014-06-13 – 2014-06-14 (×3): 20 meq via ORAL
  Filled 2014-06-13 (×5): qty 1

## 2014-06-13 NOTE — Evaluation (Signed)
Occupational Therapy Assessment and Plan  Patient Details  Name: RAYLEA ADCOX MRN: [id number] Date of Birth: 09-12-1940  OT Diagnosis: abnormal posture, acute pain, muscle weakness (generalized) and pain in joint Rehab Potential: Rehab Potential (ACUTE ONLY): Excellent ELOS: 14-18 days   Today's Date: 06/13/2014 OT Individual Time: 0900-1000 OT Individual Time Calculation (min): 60 min     Problem List:  Patient Active Problem List   Diagnosis Date Noted  . Cervical myelopathy 06/12/2014  . Tetraparesis   . Stenosis of cervical spine with myelopathy 06/09/2014  . Obesity, unspecified 10/23/2013  . Essential hypertension, benign 10/23/2013  . Chronic anticoagulation 12/28/2012  . A-fib   . 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 CKD (chronic kidney disease)   . Allergic rhinitis   . Spinal stenosis   . OA (osteoarthritis) of knee     Past Medical History:  Past Medical History  Diagnosis Date  . A-fib     -newly discovered 12/28/09 - rate controlled, Dr Etter Sjogren  . 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 CKD (chronic kidney disease)   . Allergic rhinitis   . Spinal stenosis   . OA (osteoarthritis) of knee   . Shortness of breath dyspnea     with exertion  . Hypertension   . Hypothyroidism   . Anemia   . Family history of adverse reaction to anesthesia     " my mother had too much anesthesia and went into a coma."  . Dysrhythmia     A-Fib  . Carpal tunnel syndrome, left   . Frequent urination    Past Surgical History:  Past Surgical History  Procedure Laterality Date  . Knee replacement- right knee, dr Maureen Ralphs    . Colonoscopy w/ biopsies and polypectomy    . Anterior cervical decompression/discectomy fusion 4 levels N/A 06/09/2014    Procedure: CERVICAL THREE-FOUR,CERVICAL FOUR-FIVE,CERVICAL FIVE-SIX,CERVICAL  SIX-SEVEN,ANTERIOR CERVIAL DECOMPRESSION/DISCECTOMY/FUSION;  Surgeon: Erline Levine, MD;  Location: [facility];  Service: Neurosurgery;  Laterality: N/A;    Assessment & Plan Clinical Impression: CALLIA SWIM is a 74 y.o. female with history of A fib, DOE, CKD, BUE weakness and numbness with difficulty walking due to severe cervical myelopathy with progressive cervical stenosis and weakness. Work up revealed cervical cord compression with spondylosis and patient elected to undergo C4-C6 cervical decompression with fusion and PEEK cages on 06/09/14 by Dr. Vertell Limber. Drain removed yesterday without difficulty. Therapy ongoing and CIR recommended for follow by MD and Rehab team. Patient cleared for admission 06/12/2014.  Patient currently requires mod with basic self-care skills secondary to muscle weakness, decreased cardiorespiratoy endurance, unbalanced muscle activation and decreased sitting balance, decreased standing balance, decreased postural control and difficulty maintaining precautions.  Prior to hospitalization, patient could complete ADLs/IADLs with modified independent .  Patient will benefit from skilled intervention to decrease level of assist with basic self-care skills, increase independence with basic self-care skills and increase level of independence with iADL prior to discharge home independently.  Anticipate patient will require 24 hour supervision and follow up home health.  OT - End of Session Activity Tolerance: Tolerates 30+ min activity with multiple rests Endurance Deficit: Yes OT Assessment Rehab Potential (ACUTE ONLY): Excellent OT Patient demonstrates impairments in the following area(s): Balance;Endurance;Safety;Motor OT Basic ADL's Functional Problem(s): Bathing;Dressing;Toileting OT Advanced ADL's Functional Problem(s): Simple Meal Preparation;Laundry;Light Housekeeping OT Transfers Functional Problem(s): Toilet;Tub/Shower OT  Additional Impairment(s): Fuctional  Use of Upper Extremity OT Plan OT Intensity: Minimum of 1-2 x/day, 45 to 90 minutes OT Frequency: 5 out of 7 days OT Treatment/Interventions: Balance/vestibular training;Discharge planning;Community reintegration;DME/adaptive equipment instruction;Patient/family education;Self Care/advanced ADL retraining;Therapeutic Activities;Therapeutic Exercise;UE/LE Strength taining/ROM;UE/LE Coordination activities OT Self Feeding Anticipated Outcome(s): Mod I OT Basic Self-Care Anticipated Outcome(s): Mod I OT Toileting Anticipated Outcome(s): Supervision OT Bathroom Transfers Anticipated Outcome(s): Supervision OT Recommendation Patient destination: Home Follow Up Recommendations: Home health OT Equipment Recommended: Tub/shower bench;3 in 1 bedside comode;To be determined   Skilled Therapeutic Intervention Pt seen for OT Eval and ADL bathing and dressing. Pt sitting in w/c upon arrival with daughter present and agreeable to tx. Pt ambulated into bathroom with RW and min A. She required assist and VCs for controlled descent to toilet. Pt voiced strong abdominal/ constipation pain while sitting on toilet. Pt agreeable to complete bathing/ dressing seated on toilet in hopes she would be able to have BM. Pt completed UB bathing with set-up and required mod A for UB dressing. She displayed functional ROM in R UE, and required VCs to initate use of weaker L UE. She was able to thread B LEs into pants and required assist to doff L sock due to decreased functional ROM to bend secondary to abdominal discomfort. Pt left sitting on toilet at end of session for hand off to PT.   Education provided to pt and caregiver regarding role of OT, POC, CIR, OT goals, DME, and d/c planning.   OT Evaluation Precautions/Restrictions  Precautions Precautions: Cervical;Fall Required Braces or Orthoses: Cervical Brace Cervical Brace: Hard collar;At all times Restrictions Weight Bearing Restrictions: No General Chart  Reviewed: Yes Pain Pain Assessment Pain Type: Acute pain Pain Location: Abdomen Pain Descriptors / Indicators: Aching Pain Intervention(s): Ambulation/increased activity;Repositioned Home Living/Prior Functioning Home Living Available Help at Discharge: Available 24 hours/day Type of Home: House Home Access: Stairs to enter Technical brewer of Steps: 6 Entrance Stairs-Rails: Right, Left, Can reach both Home Layout: One level  Lives With: Son IADL History Homemaking Responsibilities: Yes Meal Prep Responsibility: Primary Laundry Responsibility: Primary Cleaning Responsibility: Primary Bill Paying/Finance Responsibility: Primary Shopping Responsibility: Primary Homemaking Comments: Pt is sole caregiver for adult son with autism Occupation: Retired Prior Function Level of Independence: Independent with basic ADLs, Independent with transfers, Independent with homemaking with ambulation, Independent with gait  Able to Take Stairs?: Yes Driving: Yes Vocation: Retired Comments: Pt was Mod I with cane prior to 1/16. Adult son, Mortimer Fries, lives with her. Autistic/mute Vision/Perception  Vision- History Baseline Vision/History: Wears glasses Wears Glasses: At all times Vision- Assessment Vision Assessment?: No apparent visual deficits  Cognition Overall Cognitive Status: Within Functional Limits for tasks assessed Orientation Level: Oriented X4 Memory: Appears intact Awareness: Appears intact Problem Solving: Appears intact Safety/Judgment: Appears intact Sensation Sensation Light Touch: Appears Intact Proprioception: Appears Intact Coordination Gross Motor Movements are Fluid and Coordinated: No Fine Motor Movements are Fluid and Coordinated: Yes Coordination and Movement Description: Pt with anterior peliv tilt and fleed posture in standing Motor  Motor Motor: Within Functional Limits Motor - Skilled Clinical Observations: Generalized weakness with flexed  posture Mobility  Transfers Transfers: Sit to Stand;Stand to Sit Sit to Stand: 4: Min assist Sit to Stand Details: Manual facilitation for weight shifting;Verbal cues for technique Stand to Sit: 3: Mod assist Stand to Sit Details (indicate cue type and reason): Manual facilitation for weight shifting;Verbal cues for sequencing  Trunk/Postural Assessment  Cervical Assessment Cervical Assessment: Exceptions to [facility] (Pt in  hard colar) Postural Control Postural Control: Deficits on evaluation (Pt with flexed posture)  Balance Balance Balance Assessed: Yes Static Sitting Balance Static Sitting - Balance Support: Feet supported Static Sitting - Level of Assistance: 5: Stand by assistance Static Sitting - Comment/# of Minutes: Sitting on toilet to complete bathing and dressing Dynamic Sitting Balance Dynamic Sitting - Balance Support: Feet supported;During functional activity Dynamic Sitting - Level of Assistance: 4: Min assist Dynamic Sitting Balance - Compensations: Sitting on toilet to complete bathing and dressing Dynamic Sitting - Balance Activities: Lateral lean/weight shifting;Forward lean/weight shifting;Reaching for objects;Reaching across midline Static Standing Balance Static Standing - Balance Support: During functional activity;Right upper extremity supported;Left upper extremity supported Static Standing - Level of Assistance: 4: Min assist Static Standing - Comment/# of Minutes: Standing to complete hygiene at the toilet Extremity/Trunk Assessment RUE Assessment RUE Assessment: Within Functional Limits LUE Assessment LUE Assessment: Exceptions to [facility] LUE AROM (degrees) Overall AROM Left Upper Extremity: Deficits (Able to obtain 3/4 of full  ROM) LUE Strength LUE Overall Strength: Deficits (2+/5 MMT grade)  FIM:  See FIM for current functional assist levels  Refer to Care Plan for Long Term Goals  Recommendations for other services: None  Discharge Criteria:  Patient will be discharged from OT if patient refuses treatment 3 consecutive times without medical reason, if treatment goals not met, if there is a change in medical status, if patient makes no progress towards goals or if patient is discharged from hospital.  The above assessment, treatment plan, treatment alternatives and goals were discussed and mutually agreed upon: by patient and by family  Ernestina Patches 06/13/2014, 12:11 PM

## 2014-06-13 NOTE — Progress Notes (Signed)
PHYSICAL MEDICINE & REHABILITATION     PROGRESS NOTE    Subjective/Complaints: Had a good night. Denies pain. Throat a little sore. Slept well. Pt denies nausea, vomiting, abdominal pain, diarrhea, chest pain, shortness of breath, palpitations, dizziness   Objective: Vital Signs: Blood pressure 121/88, pulse 80, temperature 98.2 F (36.8 C), temperature source Oral, resp. rate 18, SpO2 100 %. No results found.  Recent Labs  06/13/14 0500  WBC 5.4  HGB 13.4  HCT 40.2  PLT 184    Recent Labs  06/13/14 0500  NA 139  K 3.1*  CL 100  GLUCOSE 128*  BUN 16  CREATININE 0.87  CALCIUM 9.0   CBG (last 3)  No results for input(s): GLUCAP in the last 72 hours.  Wt Readings from Last 3 Encounters:  06/09/14 101.6 kg (223 lb 15.8 oz)  05/28/14 101.152 kg (223 lb)  03/10/14 103.874 kg (229 lb)    Physical Exam:  Constitutional: She is oriented to person, place, and time. She appears well-developed and well-nourished.  Obese female with tendency to keep neck flexed to the right.  HENT: Throat is clear. Oral mucosa pink/moist Head: Normocephalic.  Eyes: Conjunctivae are normal. Pupils are equal, round, and reactive to light.  Neck:  Cervical collar in place. Incision with dry dressing in place  Cardiovascular: Normal rate and regular rhythm.  Respiratory: Effort normal and breath sounds normal.  GI: Soft. Bowel sounds are normal. She exhibits no distension. There is no tenderness.  Musculoskeletal:  Tenderness left and right traps.  Neurological: She is alert and oriented to person, place, and time.  Speech clear and more spontaneous today. She was able to follow basic commands without difficulty.  Numbness left hand and forearm as well as sensory deficits LLE. LUE 3+ deltoid, 3+ bicep,tricep, 3+hand, BLE: HF 3, ke 3+, 3+adf/pf .  RUE 4 to 4+/5. RLE: 4 to 4+/5 prox to distal Skin: Skin is warm and dry.  Psychiatric: She has a normal mood and affect.  Her behavior is normal. Thought content normal.   Assessment/Plan: 1. Functional deficits secondary to cervical stenosis with myelopathy which require 3+ hours per day of interdisciplinary therapy in a comprehensive inpatient rehab setting. Physiatrist is providing close team supervision and 24 hour management of active medical problems listed below. Physiatrist and rehab team continue to assess barriers to discharge/monitor patient progress toward functional and medical goals. FIM:                   Comprehension Comprehension Mode: Auditory Comprehension: 5-Follows basic conversation/direction: With no assist  Expression Expression Mode: Verbal Expression: 5-Expresses basic 90% of the time/requires cueing < 10% of the time.  Social Interaction Social Interaction: 7-Interacts appropriately with others - No medications needed.  Problem Solving Problem Solving: 4-Solves basic 75 - 89% of the time/requires cueing 10 - 24% of the time  Memory Memory: 5-Recognizes or recalls 90% of the time/requires cueing < 10% of the time  Medical Problem List and Plan: 1. Functional deficits secondary to cervical stenosis with myelopathy 2. DVT Prophylaxis/Anticoagulation: Mechanical: Sequential compression devices, below knee Bilateral lower extremities 3. Pain Management: Continue vicodin prn.   K pad to help with muscle spasms.  scheduled robaxin qid.  4. Mood: LCSW to follow for evaluation and support.  5. Neuropsych: This patient is capable of making decisions on her own behalf. 6. Skin/Wound Care: Routine pressure relief measures.  7. Fluids/Electrolytes/Nutrition: Monitor I/O. Encourage Po fluid intake.  8. Hypokalemia:  Continue to replace 9. HTN: Montior BP every 8 hours. Continue Norvasc, coreg and avapro.  10. A fib: Was on Xarelto prior to admission. Continue coreg for rate control. Monitor heart rate with increase in activity as noted to have HR up to 105 at rest with  PT/OT   LOS (Days) 1 A FACE TO FACE EVALUATION WAS PERFORMED  SWARTZ,ZACHARY T 06/13/2014 7:58 AM

## 2014-06-13 NOTE — Progress Notes (Signed)
Initial Nutrition Assessment  DOCUMENTATION CODES:  Obesity unspecified  INTERVENTION:  Ensure Enlive (each supplement provides 350kcal and 20 grams of protein)  NUTRITION DIAGNOSIS:  Increased nutrient needs related to acute illness as evidenced by estimated needs.  GOAL:  Patient will meet greater than or equal to 90% of their needs  MONITOR:  PO intake, Supplement acceptance, Weight trends, Labs, I & O's  REASON FOR ASSESSMENT:  Malnutrition Screening Tool    ASSESSMENT: Pt is a 74 y.o. female with history of A fib, DOE, CKD, BUE weakness and numbness with difficulty walking due to severe cervical myelopathy with progressive cervical stenosis and weakness. Work up revealed cervical cord compression with spondylosis and patient elected to undergo C4-C6 cervical decompression with fusion and PEEK cages on 06/09/14  Pt is starting to feel better, the throat is less sore and swollen allowing for pt to eat better. Pt has been seen by RD when in acute care who prescribed Ensure Enlive, will continue to provide. Pt states she does not like them much, but drinks only because she knows it is good for her. Suggested trying over ice. Encouraged pt to order protein with every meal to meet her protein needs.  Height:  Ht Readings from Last 1 Encounters:  06/09/14 5' 3.5" (1.613 m)    Weight:  Wt Readings from Last 1 Encounters:  06/09/14 223 lb 15.8 oz (101.6 kg)    Ideal Body Weight:  53.4 kg  Wt Readings from Last 10 Encounters:  06/09/14 223 lb 15.8 oz (101.6 kg)  05/28/14 223 lb (101.152 kg)  03/10/14 229 lb (103.874 kg)  02/17/14 236 lb (107.049 kg)  10/23/13 236 lb (107.049 kg)  07/15/13 237 lb 1.9 oz (107.557 kg)  12/28/12 245 lb (111.131 kg)    BMI:  There is no weight on file to calculate BMI.  Estimated Nutritional Needs:  Kcal:  1800 - 2000  Protein:  90 - 110 g  Fluid:  1.9 L  Skin:  Wound (see comment) (surgical incision on neck)  Diet Order:   Diet Heart Room service appropriate?: Yes; Fluid consistency:: Thin  EDUCATION NEEDS:  No education needs identified at this time   Intake/Output Summary (Last 24 hours) at 06/13/14 1341 Last data filed at 06/13/14 0800  Gross per 24 hour  Intake    360 ml  Output      0 ml  Net    360 ml    Last BM:  4/29   Kista Robb A. [facility] Dietetic Intern Pager: [phone] 06/13/2014 1:49 PM

## 2014-06-13 NOTE — Progress Notes (Signed)
Patient information reviewed and entered into eRehab system by Jeylin Woodmansee, RN, CRRN, PPS Coordinator.  Information including medical coding and functional independence measure will be reviewed and updated through discharge.     Per nursing patient was given "Data Collection Information Summary for Patients in Inpatient Rehabilitation Facilities with attached "Privacy Act Statement-Health Care Records" upon admission.  

## 2014-06-13 NOTE — Evaluation (Signed)
Physical Therapy Assessment and Plan  Patient Details  Name: Jasmine Hamilton MRN: [id number] Date of Birth: 06/03/1940  PT Diagnosis: Abnormal posture, Difficulty walking, Muscle weakness and Acute pain Rehab Potential: Good ELOS: 14-18 days   Today's Date: 06/13/2014 PT Individual Time: 1000-1100 PT Individual Time Calculation (min): 60 min    Problem List:  Patient Active Problem List   Diagnosis Date Noted  . Cervical myelopathy 06/12/2014  . Tetraparesis   . Stenosis of cervical spine with myelopathy 06/09/2014  . Obesity, unspecified 10/23/2013  . Essential hypertension, benign 10/23/2013  . Chronic anticoagulation 12/28/2012  . A-fib   . 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 CKD (chronic kidney disease)   . Allergic rhinitis   . Spinal stenosis   . OA (osteoarthritis) of knee     Past Medical History:  Past Medical History  Diagnosis Date  . A-fib     -newly discovered 12/28/09 - rate controlled, Dr Etter Sjogren  . 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 CKD (chronic kidney disease)   . Allergic rhinitis   . Spinal stenosis   . OA (osteoarthritis) of knee   . Shortness of breath dyspnea     with exertion  . Hypertension   . Hypothyroidism   . Anemia   . Family history of adverse reaction to anesthesia     " my mother had too much anesthesia and went into a coma."  . Dysrhythmia     A-Fib  . Carpal tunnel syndrome, left   . Frequent urination    Past Surgical History:  Past Surgical History  Procedure Laterality Date  . Knee replacement- right knee, dr Maureen Ralphs    . Colonoscopy w/ biopsies and polypectomy    . Anterior cervical decompression/discectomy fusion 4 levels N/A 06/09/2014    Procedure: CERVICAL THREE-FOUR,CERVICAL FOUR-FIVE,CERVICAL FIVE-SIX,CERVICAL SIX-SEVEN,ANTERIOR CERVIAL DECOMPRESSION/DISCECTOMY/FUSION;   Surgeon: Erline Levine, MD;  Location: [facility];  Service: Neurosurgery;  Laterality: N/A;    Assessment & Plan Clinical Impression: Patient is a 74 y.o. year old female with recent admission to the hospital with history of A fib, DOE, CKD, BUE weakness and numbness with difficulty walking due to severe cervical myelopathy with progressive cervical stenosis and weakness. Work up revealed cervical cord compression with spondylosis and patient elected to undergo C4-C6 cervical decompression with fusion and PEEK cages on 06/09/14 by Dr. Vertell Limber. Drain removed yesterday without difficulty. Therapy ongoing and CIR recommended for follow by MD and Rehab team.  Patient transferred to CIR on 06/12/2014 .   Patient currently requires mod with mobility secondary to muscle weakness and muscle joint tightness, decreased cardiorespiratoy endurance and decreased sitting balance, decreased standing balance, decreased postural control and decreased balance strategies.  Prior to hospitalization, patient was modified independent  with mobility and lived with Son in a House home.  Home access is 6Stairs to enter.  Patient will benefit from skilled PT intervention to maximize safe functional mobility, minimize fall risk and decrease caregiver burden for planned discharge home with 24 hour supervision.  Anticipate patient will benefit from follow up [place] at discharge.  PT - End of Session Activity Tolerance: Decreased this session Endurance Deficit: Yes Endurance Deficit Description: Pt very limited by nausea and bowel issues this session. PT Assessment Rehab Potential (ACUTE/IP ONLY): Good PT Patient demonstrates impairments in the following area(s): Balance;Endurance;Motor;Pain;Skin Integrity PT Transfers Functional  Problem(s): Bed Mobility;Bed to Chair;Car;Furniture PT Locomotion Functional Problem(s): Ambulation;Stairs PT Plan PT Intensity: Minimum of 1-2 x/day ,45 to 90 minutes PT Frequency: 5 out of 7 days PT  Duration Estimated Length of Stay: 14-18 days PT Treatment/Interventions: Ambulation/gait training;Balance/vestibular training;Community reintegration;Discharge planning;Disease management/prevention;DME/adaptive equipment instruction;Functional mobility training;Neuromuscular re-education;Pain management;Patient/family education;Psychosocial support;Skin care/wound management;Stair training;Therapeutic Activities;Therapeutic Exercise;UE/LE Strength taining/ROM;UE/LE Coordination activities;Wheelchair propulsion/positioning PT Transfers Anticipated Outcome(s): S overall PT Locomotion Anticipated Outcome(s): S household gait; min A stairs PT Recommendation Follow Up Recommendations: Home health PT;24 hour supervision/assistance Patient destination: Home Equipment Recommended: Rolling walker with 5" wheels  Skilled Therapeutic Intervention Individual treatment initiated but very limited assessment due to pt c/o nausea, on toilet, and pain in abdomen. Pt able to perform multiple sit to stands (from toilet and from w/c) with min to mod A needed and poor standing tolerance with very flexed posture. Education to pt and daughter on importance of upright mobility to aid with positive results for BM as pt with c/o constipation and nausea. Daughter providing encouragement and pt agreeable to attempt short gait trial about 5' with min A and cues for upright posture, step length, and encouragement. Transferred to recliner at end of session with min A using RW. Goals and POC for PT discussed with pt and daughter who verbalized understanding.   PT Evaluation Precautions/Restrictions Precautions Precautions: Cervical;Fall Required Braces or Orthoses: Cervical Brace Cervical Brace: Hard collar;At all times Restrictions Weight Bearing Restrictions: No Pain C/o pain in stomach and reports nausea. Very limiting for patient during evaluation. RN aware.  Home Living/Prior Functioning Home Living Available Help at  Discharge: Available 24 hours/day (Pt's daughter Jasmine Hamilton to be there 24/7. Pt's son is autistic) Type of Home: House Home Access: Stairs to enter [employer] of Steps: 6 Entrance Stairs-Rails: Right;Left;Can reach both Home Layout: One level  Lives With: Son Prior Function Level of Independence: Independent with basic ADLs;Independent with transfers;Independent with homemaking with ambulation;Independent with gait  Able to Take Stairs?: Yes Driving: Yes Vocation: Retired Comments: Pt was Mod I with cane prior to 1/16. Adult son, Jasmine Hamilton, lives with her. Autistic/mute  Cognition Overall Cognitive Status: Within Functional Limits for tasks assessed Orientation Level: Oriented X4 Memory: Appears intact Awareness: Appears intact Problem Solving: Appears intact Safety/Judgment: Appears intact Sensation Sensation Light Touch: Appears Intact Proprioception: Appears Intact Coordination Gross Motor Movements are Fluid and Coordinated: Yes Fine Motor Movements are Fluid and Coordinated: Yes Motor  Motor Motor: Abnormal postural alignment and control Motor - Skilled Clinical Observations: Pt presents with very flexed posture and generalized weakness   Trunk/Postural Assessment  Cervical Assessment Cervical Assessment: Exceptions to [facility] (Cervical hard collar) Thoracic Assessment Thoracic Assessment: Exceptions to [facility] (maintains very flexed posture) Lumbar Assessment Lumbar Assessment: Exceptions to [facility] (posterior pelvic tilt) Postural Control Postural Control: Deficits on evaluation (Pt maintains very flexed posture in standing)  Balance Balance Balance Assessed: Yes Static Sitting Balance Static Sitting - Level of Assistance: 5: Stand by assistance Dynamic Sitting Balance Dynamic Sitting - Level of Assistance: 4: Min assist Static Standing - Level of Assistance: 4: Min assist Dynamic Standing Balance Dynamic Standing - Level of Assistance: 3: Mod assist Extremity  Assessment  RUE Assessment RUE Assessment: Within Functional Limits LUE Assessment LUE Assessment: Exceptions to WFL LUE AROM (degrees) Overall AROM Left Upper Extremity: Deficits (Able to obtain 3/4 of full  ROM) LUE Strength LUE Overall Strength: Deficits (2+/5 MMT grade) RLE Assessment RLE Assessment: Exceptions to [facility] RLE Strength RLE Overall Strength Comments: grossly 3+/5 LLE Assessment  LLE Assessment: Exceptions to [facility] LLE Strength LLE Overall Strength Comments: grossly 3+/5  FIM:  FIM - Bed/Chair Transfer Bed/Chair Transfer: 3: Chair or W/C > Bed: Mod A (lift or lower assist) FIM - Locomotion: Wheelchair Locomotion: Wheelchair: 1: Total Assistance/staff pushes wheelchair (Pt<25%) FIM - Locomotion: Ambulation Locomotion: Ambulation Assistive Devices: Walker - Rolling Locomotion: Ambulation: 1: Travels less than 50 ft with minimal assistance (Pt.>75%) FIM - Locomotion: Stairs Locomotion: Stairs: 0: Activity did not occur (unable to assess on eval due pt ill)   Refer to Care Plan for Long Term Goals  Recommendations for other services: None  Discharge Criteria: Patient will be discharged from PT if patient refuses treatment 3 consecutive times without medical reason, if treatment goals not met, if there is a change in medical status, if patient makes no progress towards goals or if patient is discharged from hospital.  The above assessment, treatment plan, treatment alternatives and goals were discussed and mutually agreed upon: by patient and by family  Juanna Cao, PT, DPT  06/13/2014, 3:45 PM

## 2014-06-13 NOTE — Interval H&P Note (Signed)
Jasmine Hamilton was admitted today to Inpatient Rehabilitation with the diagnosis of cervical myelopathy.  The patient's history has been reviewed, patient examined, and there is no change in status.  Patient continues to be appropriate for intensive inpatient rehabilitation.  I have reviewed the patient's chart and labs.  Questions were answered to the patient's satisfaction.  [name] 06/13/2014, 6:37 AM

## 2014-06-13 NOTE — H&P (View-Only) (Signed)
Physical Medicine and Rehabilitation Admission H&P   CC: BUE weakness with gait disorder   HPI: EVELENA MASCI is a 74 y.o. female with history of A fib, DOE, CKD, BUE weakness and numbness with difficulty walking due to severe cervical myelopathy with progressive cervical stenosis and weakness. Work up revealed cervical cord compression with spondylosis and patient elected to undergo C4-C6 cervical decompression with fusion and PEEK cages on 06/09/14 by Dr. Vertell Limber. Drain removed yesterday without difficulty. Therapy ongoing and CIR recommended for follow by MD and Rehab team. Patient cleared for admission today.   Patient has some throat discomfort with swallowing but also has incisional pain that she complains of. Denies any burning pain in the hands or the feet.  Review of Systems  HENT: Negative for hearing loss.  Eyes: Negative for blurred vision and double vision.  Respiratory: Negative for cough and shortness of breath.  Cardiovascular: Negative for chest pain and palpitations.  Gastrointestinal: Negative for heartburn, nausea, abdominal pain and constipation.  Genitourinary: Positive for urgency (chronic with urge incontinence) and frequency.  Musculoskeletal: Positive for neck pain (neck and shoulder pain).  Neurological: Positive for tingling and focal weakness. Negative for dizziness and headaches.      Past Medical History  Diagnosis Date  . A-fib     -newly discovered 12/28/09 - rate controlled, Dr Etter Sjogren  . 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 CKD (chronic kidney disease)   . Allergic rhinitis   . Spinal stenosis   . OA (osteoarthritis) of knee   . Shortness of breath dyspnea     with exertion  . Hypertension   . Hypothyroidism   . Anemia   . Family history of adverse reaction to anesthesia     " my mother had too much anesthesia and  went into a coma."  . Dysrhythmia     A-Fib  . Carpal tunnel syndrome, left   . Frequent urination     Past Surgical History  Procedure Laterality Date  . Knee replacement- right knee, dr Maureen Ralphs    . Colonoscopy w/ biopsies and polypectomy    . Anterior cervical decompression/discectomy fusion 4 levels N/A 06/09/2014    Procedure: CERVICAL THREE-FOUR,CERVICAL FOUR-FIVE,CERVICAL FIVE-SIX,CERVICAL SIX-SEVEN,ANTERIOR CERVIAL DECOMPRESSION/DISCECTOMY/FUSION; Surgeon: Erline Levine, MD; Location: [facility]; Service: Neurosurgery; Laterality: N/A;    Family History  Problem Relation Age of Onset  . Hypertension Mother   . Diabetes Mother   . Diabetes Brother   . Heart attack Brother   . Kidney disease Sister   . Stroke Mother   . Fainting Father     Social History: Lives alone. Retired Quarry manager. Youngest daughter has been assisting for the past 2 weeks and another daughter plans on assisting past discharge. She reports that she has never smoked. She has never used smokeless tobacco. She reports that she does not drink alcohol or use illicit drugs.    Allergies  Allergen Reactions  . Codeine Swelling  . Lotensin [Benazepril Hcl] Cough  . Verelan [Verapamil Hcl Er] Swelling    Medications Prior to Admission  Medication Sig Dispense Refill  . amLODipine (NORVASC) 5 MG tablet Take 5 mg by mouth daily.    . carvedilol (COREG) 25 MG tablet Take 25 mg by mouth daily.    . furosemide (LASIX) 40 MG tablet Take 40 mg by mouth daily as needed for fluid.     . Multiple Vitamin (MULTIVITAMIN) capsule Take 1  capsule by mouth daily.    . traMADol-acetaminophen (ULTRACET) 37.5-325 MG per tablet every 4 (four) hours as needed.     . valsartan (DIOVAN) 320 MG tablet Take 320 mg by mouth daily.    Alveda Reasons 20 MG TABS tablet TAKE 1 TABLET BY MOUTH ONCE DAILY WITH SUPPER 30  tablet 5  . potassium chloride SA (K-DUR,KLOR-CON) 20 MEQ tablet Take 1 tablet by mouth daily as needed (only take with lasix).       Home: Home Living Family/patient expects to be discharged to:: Inpatient rehab Living Arrangements: Other (Comment) (Pt's 74 yo old autistic son, Mortimer Fries, lives with her. He h) Available Help at Discharge: Available 24 hours/day (Dtr, Estill Batten, to take FMLA for pt to d/c home with 24/7 super) Type of Home: House Home Access: Stairs to enter [employer] of Steps: 6 Entrance Stairs-Rails: Right, Left, Can reach both Home Layout: One level Lives With: Son  Functional History: Prior Function Level of Independence: Needs assistance Gait / Transfers Assistance Needed: Warden/ranger for ambulation.  ADL's / Homemaking Assistance Needed: Nicole Kindred, dtr was assisting with adls over past 2 to 3 weeks due to decline. Overall decline for 3 months Comments: Pt was Mod I with cane prior to 1/16. Adult son, Mortimer Fries, lives with her. Autistic/mute  Functional Status:  Mobility: Bed Mobility Overal bed mobility: Needs Assistance Bed Mobility: Sit to Sidelying Rolling: Min assist Sidelying to sit: Min assist, HOB elevated Sit to sidelying: Mod assist General bed mobility comments: A with Bil LEs to return to bed.  Transfers Overall transfer level: Needs assistance Equipment used: Rolling walker (2 wheeled) Transfers: Sit to/from Stand Sit to Stand: Min assist Stand pivot transfers: Min assist General transfer comment: cues for reaching back to bed prior to coming to sit.  Ambulation/Gait Ambulation/Gait assistance: Min assist Ambulation Distance (Feet): 30 Feet (x2) Assistive device: Rolling walker (2 wheeled) Gait Pattern/deviations: Step-through pattern, Decreased stride length, Trunk flexed General Gait Details: pt continues to lean heavily on RW and needs cues for more upright psoture and positioning within RW.      ADL: ADL Overall ADL's : Needs assistance/impaired Eating/Feeding: Set up, Sitting Grooming: Set up, Sitting, Cueing for compensatory techniques Upper Body Bathing: Minimal assitance, Sitting, Cueing for compensatory techniques Lower Body Bathing: Moderate assistance, Sit to/from stand, Cueing for compensatory techniques, With adaptive equipment Upper Body Dressing : Set up, Sitting, Cueing for compensatory techniques Lower Body Dressing: Moderate assistance, Sit to/from stand, With adaptive equipment, Cueing for compensatory techniques Toilet Transfer: Minimal assistance, Stand-pivot, BSC, RW Toileting- Clothing Manipulation and Hygiene: Moderate assistance, With adaptive equipment, Cueing for compensatory techniques, Sit to/from stand Tub/ Shower Transfer: Minimal assistance, Ambulation, 3 in 1, Rolling walker General ADL Comments: Pt completed SPT from recliner to EOB and completed bed mobility as detailed below. Educated pt and daughter on techniques and AE for completion of ADLs with cervical precautions. Educated pt and daughter on Art gallery manager,  Cognition: Cognition Overall Cognitive Status: Within Functional Limits for tasks assessed Orientation Level: Oriented X4 Cognition Arousal/Alertness: Awake/alert Behavior During Therapy: WFL for tasks assessed/performed Overall Cognitive Status: Within Functional Limits for tasks assessed  Physical Exam: Blood pressure 114/63, pulse 100, temperature 98 F (36.7 C), temperature source Oral, resp. rate 18, height 5' 3.5" (1.613 m), weight 101.6 kg (223 lb 15.8 oz), SpO2 100 %. Physical Exam  Nursing note and vitals reviewed. Constitutional: She is oriented to person, place, and time. She appears well-developed and well-nourished.  Obese female with  tendency to keep neck flexed to the right.  HENT: Throat is clear Head: Normocephalic.  Eyes: Conjunctivae are normal. Pupils are equal, round, and reactive to light.  Neck:   Cervical collar in place. Incision with dry dressing in place  Cardiovascular: Normal rate and regular rhythm.  Respiratory: Effort normal and breath sounds normal.  GI: Soft. Bowel sounds are normal. She exhibits no distension. There is no tenderness.  Musculoskeletal:  Tenderness left and right traps.  Neurological: She is alert and oriented to person, place, and time.  Speech clear and more spontaneous today. She was able to follow basic commands without difficulty.  Numbness left hand and forearm as well as sensory deficits LLE. LUE 3- deltoid, 3- bicep,tricep, 3 hand, BLE: HF 3-, ke +, adf/pf .  Skin: Skin is warm and dry.  Psychiatric: She has a normal mood and affect. Her behavior is normal. Thought content normal.     Lab Results Last 48 Hours    No results found for this or any previous visit (from the past 48 hour(s)).    Imaging Results (Last 48 hours)    No results found.     Medical Problem List and Plan: 1. Functional deficits secondary to cervical stenosis with myelopathy 2. DVT Prophylaxis/Anticoagulation: Mechanical: Sequential compression devices, below knee Bilateral lower extremities 3. Pain Management: Continue vicodin prn. Will add K pad to help with muscle spasms. Will schedule robaxin qid.  4. Mood: LCSW to follow for evaluation and support.  5. Neuropsych: This patient is capable of making decisions on her own behalf. 6. Skin/Wound Care: Routine pressure relief measures.  7. Fluids/Electrolytes/Nutrition: Monitor I/O. Encourage Po fluid intake.  8. Hypokalemia: Noted on pre-op work up. Recheck labs in am. 9. HTN: Montior BP every 8 hours. Continue Norvasc, coreg and avapro.  10. A fib: Was on Xarelto prior to admission. Continue coreg for rate control. Monitor heart rate with increase in activity as noted to have HR up to 105 at rest     Post Admission Physician Evaluation: 1. Functional deficits secondary to cervical stenosis with  Myelopathy.  2. Patient is admitted to receive collaborative, interdisciplinary care between the physiatrist, rehab nursing staff, and therapy team. 3. Patient's level of medical complexity and substantial therapy needs in context of that medical necessity cannot be provided at a lesser intensity of care such as a SNF. 4. Patient has experienced substantial functional loss from his/her baseline which was documented above under the "Functional History" and "Functional Status" headings. Judging by the patient's diagnosis, physical exam, and functional history, the patient has potential for functional progress which will result in measurable gains while on inpatient rehab. These gains will be of substantial and practical use upon discharge in facilitating mobility and self-care at the household level. 5. Physiatrist will provide 24 hour management of medical needs as well as oversight of the therapy plan/treatment and provide guidance as appropriate regarding the interaction of the two. 6. 24 hour rehab nursing will assist with bladder management, bowel management, safety, skin/wound care, disease management, medication administration, pain management and patient education and help integrate therapy concepts, techniques,education, etc. 7. PT will assess and treat for/with: pre gait, gait training, endurance , safety, equipment, neuromuscular re education. Goals are: Sup. 8. OT will assess and treat for/with: ADLs, Cognitive perceptual skills, Neuromuscular re education, safety, endurance, equipment. Goals are: Sup. Therapy May not proceed with showering this patient. 9. SLP will assess and treat for/with: NA. Goals are: NA. 10. Case Management  and Education officer, museum will assess and treat for psychological issues and discharge planning. 11. Team conference will be held weekly to assess progress toward goals and to determine barriers to discharge. 12. Patient will receive at least 3 hours of therapy per  day at least 5 days per week. 13. ELOS: 15-22  14. Prognosis: good     [name] M.D. [facility] FAAPM&R (Sports Med, Neuromuscular Med) Diplomate Am Board of Electrodiagnostic Med  06/12/2014

## 2014-06-13 NOTE — Progress Notes (Signed)
Occupational Therapy Session Note  Patient Details  Name: Jasmine Hamilton MRN: [id number] Date of Birth: 11/01/1940  Today's Date: 06/13/2014 OT Individual Time: 1445-1600 OT Individual Time Calculation (min): 75 min    Short Term Goals: Week 1:  OT Short Term Goal 1 (Week 1): Pt will complete toileting task with supervision OT Short Term Goal 2 (Week 1): Pt will dress LB with supervision OT Short Term Goal 3 (Week 1): Pt will dress UB mod I OT Short Term Goal 4 (Week 1): Pt will tolerate 15 minutes of functional sitting task without need for rest break.  Skilled Therapeutic Interventions/Progress Updates:  Balance/Self Care/advanced ADL retraining;Therapeutic Activities;Therapeutic Exercise;UE/LE Strength taining/ROM;UE/LE Coordination activities,  Pt, ambulated with RW to bathroom with mod assist.  Pt. Needed mod assist with stand to sit. Performed standing for peri care with minimal assist.  Pt. Stood x2 for 1-2 minutes.  She was dyspnea 3/4 onscale.  Ambulated to bed area with RW and minimal assist. Pt.  Donned bra with OT assisting with hooking.   In gym did grip strength on R= 40 #; L= 28#; 9 hole peg= 29 sec on right; 2 min. 29 sec on left; lateral pinch right= 10; left= 8; 3 jaw chuck= 9 on right; 4 on left.  Stood for 2 minutes whilde using LUE and RUE in setting up checkers. Left pt in wc with  call bell,phone within reach. Daughter in room with her as well.       Therapy Documentation Precautions:  Precautions Precautions: Cervical, Fall Required Braces or Orthoses: Cervical Brace Cervical Brace: Hard collar, At all times Restrictions Weight Bearing Restrictions: No General:   Vital Signs:  Pain:  3.10  neck   ADL:   Exercises:   Other Treatments:    See FIM for current functional status  Therapy/Group: Individual Therapy  Lisa Roca 06/13/2014, 6:32 PM

## 2014-06-14 ENCOUNTER — Inpatient Hospital Stay (HOSPITAL_COMMUNITY): Payer: Medicare Other

## 2014-06-14 ENCOUNTER — Inpatient Hospital Stay (HOSPITAL_COMMUNITY): Payer: Medicare Other | Admitting: Physical Therapy

## 2014-06-14 ENCOUNTER — Inpatient Hospital Stay (HOSPITAL_COMMUNITY): Payer: Medicare Other | Admitting: Occupational Therapy

## 2014-06-14 DIAGNOSIS — K5901 Slow transit constipation: Secondary | ICD-10-CM

## 2014-06-14 DIAGNOSIS — R110 Nausea: Secondary | ICD-10-CM

## 2014-06-14 MED ORDER — ONDANSETRON 4 MG PO TBDP
4.0000 mg | ORAL_TABLET | Freq: Three times a day (TID) | ORAL | Status: DC | PRN
  Administered 2014-06-14 – 2014-06-15 (×2): 4 mg via ORAL
  Filled 2014-06-14 (×2): qty 1

## 2014-06-14 MED ORDER — POTASSIUM CHLORIDE CRYS ER 20 MEQ PO TBCR
40.0000 meq | EXTENDED_RELEASE_TABLET | Freq: Two times a day (BID) | ORAL | Status: DC
  Administered 2014-06-14 – 2014-06-15 (×2): 40 meq via ORAL
  Filled 2014-06-14 (×2): qty 2

## 2014-06-14 MED ORDER — SIMETHICONE 80 MG PO CHEW
80.0000 mg | CHEWABLE_TABLET | Freq: Four times a day (QID) | ORAL | Status: DC | PRN
  Administered 2014-06-14: 80 mg via ORAL
  Filled 2014-06-14 (×2): qty 1

## 2014-06-14 NOTE — Progress Notes (Addendum)
Patient was complaining of nausea, zofran given prn. She had a bowel movement this morning that was medium soft brown in color. Went to the bathroom 3x after bowel movement  but only passed gas. Positive bowel sounds on 4 quadrants. DG of the abdomen was ordered.   Patient went to the bathroom with PT and she had a small formed stool. Per patient, she still feels nauseated with tight feeling and  fullness and her abdomen. Dr. Read Drivers notified. Order received for Stat DG of abdomen.

## 2014-06-14 NOTE — IPOC Note (Signed)
Overall Plan of Care [facility]) Patient Details Name: Jasmine Hamilton MRN: [id number] DOB: [birth year]  Admitting Diagnosis: cervical stenosis with myelopathy  Hospital Problems: Active Problems:   Cervical myelopathy     Functional Problem List: Nursing Bowel, Medication Management, Pain, Safety, Skin Integrity  PT Balance, Endurance, Motor, Pain, Skin Integrity  OT Balance, Endurance, Safety, Motor  SLP    TR         Basic ADL's: OT Bathing, Dressing, Toileting     Advanced  ADL's: OT Simple Meal Preparation, Laundry, Light Housekeeping     Transfers: PT Bed Mobility, Bed to Chair, Car, Manufacturing systems engineer, Metallurgist: PT Ambulation, Stairs     Additional Impairments: OT Fuctional Use of Upper Extremity  SLP        TR      Anticipated Outcomes Item Anticipated Outcome  Self Feeding Mod I  Swallowing      Basic self-care  Mod I  Toileting  Mod I   Bathroom Transfers Mod I  Bowel/Bladder  continent of bowel and bladder LBM 4/27  Transfers  S overall  Locomotion  S household gait; min A stairs  Communication     Cognition     Pain  <4  Safety/Judgment  supervision   Therapy Plan: PT Intensity: Minimum of 1-2 x/day ,45 to 90 minutes PT Frequency: 5 out of 7 days PT Duration Estimated Length of Stay: 14-18 days OT Intensity: Minimum of 1-2 x/day, 45 to 90 minutes OT Frequency: 5 out of 7 days OT Duration/Estimated Length of Stay: 14-18 days         Team Interventions: Nursing Interventions Patient/Family Education, Bowel Management, Skin Care/Wound Management, Medication Management, Pain Management  PT interventions Ambulation/gait training, Training and development officer, Community reintegration, Discharge planning, Disease management/prevention, DME/adaptive equipment instruction, Functional mobility training, Neuromuscular re-education, Pain management, Patient/family education, Psychosocial support, Skin care/wound management,  Stair training, Therapeutic Activities, Therapeutic Exercise, UE/LE Strength taining/ROM, UE/LE Coordination activities, Wheelchair propulsion/positioning  OT Interventions Training and development officer, Discharge planning, Community reintegration, Engineer, drilling, Patient/family education, Self Care/advanced ADL retraining, Therapeutic Activities, Therapeutic Exercise, UE/LE Strength taining/ROM, UE/LE Coordination activities  SLP Interventions    TR Interventions    SW/CM Interventions Discharge Planning, Psychosocial Support, Patient/Family Education    Team Discharge Planning: Destination: PT-Home ,OT- Home , SLP-  Projected Follow-up: PT-Home health PT, 24 hour supervision/assistance, OT-  Home health OT, SLP-  Projected Equipment Needs: PT-Rolling walker with 5" wheels, OT- Tub/shower bench, 3 in 1 bedside comode, To be determined, SLP-  Equipment Details: PT- , OT-  Patient/family involved in discharge planning: PT- Patient, Family member/caregiver,  OT-Patient, Family member/caregiver, SLP-   MD ELOS: 15-22d Medical Rehab Prognosis:  Good Assessment: 74 y.o. female with history of A fib, DOE, CKD, BUE weakness and numbness with difficulty walking due to severe cervical myelopathy with progressive cervical stenosis and weakness. Work up revealed cervical cord compression with spondylosis and patient elected to undergo C4-C6 cervical decompression with fusion and PEEK cages on 06/09/14 by Dr. Vertell Limber Now requiring 24/7 Rehab RN,MD, as well as CIR level PT, OT and SLP.  Treatment team will focus on ADLs and mobility with goals set at Sup/ModI   See Team Conference Notes for weekly updates to the plan of care

## 2014-06-14 NOTE — Progress Notes (Signed)
Physical Therapy Session Note  Patient Details  Name: Jasmine Hamilton MRN: [id number] Date of Birth: May 14, 1940  Today's Date: 06/14/2014 PT Individual Time: 7858-8502 PT Individual Time Calculation (min): 30 min   Short Term Goals: Week 1:  PT Short Term Goal 1 (Week 1): Pt will be able to perform functional transfers with min A overall PT Short Term Goal 2 (Week 1): Pt will be able to perform bed mobility with min A  PT Short Term Goal 3 (Week 1): Pt will be able to gait with RW x 50' with min A PT Short Term Goal 4 (Week 1): Pt will be able to go up/down 6 steps with mod A  Skilled Therapeutic Interventions/Progress Updates:   Pt received in recliner; pt denies pain but continues to report significant nausea and discomfort in abdomen due to constipation.  Pt agreeable to attempt stairs but requesting to use toilet first.  Pt required increased time to initiate sit > stand from recliner with mod A and pt unable to remain standing due to nausea.  Pt returned to sitting and given cold wash cloth to decrease nausea.  Pt performed sit > stand again and ambulated with RW to toilet x 15' with mod A with pt becoming increasingly unsafe with RW negotiating obstacles and pivoting due to nausea and being in a hurry to sit down.  Required mod A to sit safely on toilet.  Pt spent prolonged period of time on toilet attempting to have BM; pt crying out in discomfort due to nausea and feeling full.  RN alerted to nausea and pt discomfort.  Pt able to perform hygiene but required mod A to stand from toilet and assist for all clothing management.  Pt transferred toilet > wc > recliner stand pivot mod A with max verbal cues for safety while pivoting.  Pt set up in recliner with all items within reach; unable to perform stairs this session due to continued discomfort with mobility and pt being ill.    Therapy Documentation Precautions:  Precautions Precautions: Cervical, Fall Required Braces or Orthoses:  Cervical Brace Cervical Brace: Hard collar, At all times Restrictions Weight Bearing Restrictions: No Locomotion : Ambulation Ambulation/Gait Assistance: 3: Mod assist   See FIM for current functional status  Therapy/Group: Individual Therapy  Raylene Everts [facility] 06/14/2014, 2:53 PM

## 2014-06-14 NOTE — Progress Notes (Signed)
Occupational Therapy Session Note  Patient Details  Name: Jasmine Hamilton MRN: [id number] Date of Birth: August 12, 1940  Today's Date: 06/14/2014 OT Individual Time:  -   1500-1530  (30 min)      Short Term Goals: Week 1:  OT Short Term Goal 1 (Week 1): Pt will complete toileting task with supervision OT Short Term Goal 2 (Week 1): Pt will dress LB with supervision OT Short Term Goal 3 (Week 1): Pt will dress UB mod I OT Short Term Goal 4 (Week 1): Pt will tolerate 15 minutes of functional sitting task without need for rest break.      Skilled Therapeutic Interventions/Progress Updates:    Pt sitting in recliner upon OT arrival.  She stated she had nausea and constipation.  She agreed to try to stand and take BP because she reported feeling more hot when she stood.  BP in sitting was 98/57, Hr= 79.   Attempted to stand but pt unable.  BP in sitting after attempting to stand was 87/73, HR= 81.   Informed RN who came and checked on pt.    Therapy Documentation Precautions:  Precautions Precautions: Cervical, Fall Required Braces or Orthoses: Cervical Brace Cervical Brace: Hard collar, At all times Restrictions Weight Bearing Restrictions: No      Pain:  none      :    See FIM for current functional status  Therapy/Group: Individual Therapy  Lisa Roca 06/14/2014, 7:02 PM

## 2014-06-14 NOTE — Progress Notes (Signed)
[place] PHYSICAL MEDICINE & REHABILITATION     PROGRESS NOTE    Subjective/Complaints: Stomach feels full, no vomiting but nauseated with poor appetite, RN tried compazine without help, no prior hx of bowel problems  No abd pain just fullness  ROS otherwise neg   Objective: Vital Signs: Blood pressure 127/73, pulse 54, temperature 97.9 F (36.6 C), temperature source Oral, resp. rate 18, SpO2 98 %. No results found.  Recent Labs  06/13/14 0500  WBC 5.4  HGB 13.4  HCT 40.2  PLT 184    Recent Labs  06/13/14 0500  NA 139  K 3.1*  CL 100  GLUCOSE 128*  BUN 16  CREATININE 0.87  CALCIUM 9.0   CBG (last 3)  No results for input(s): GLUCAP in the last 72 hours.  Wt Readings from Last 3 Encounters:  06/09/14 101.6 kg (223 lb 15.8 oz)  05/28/14 101.152 kg (223 lb)  03/10/14 103.874 kg (229 lb)    Physical Exam:  Constitutional: She is oriented to person, place, and time. She appears well-developed and well-nourished.  Obese female HENT: Throat is clear. Oral mucosa pink/moist Head: Normocephalic.  Eyes: Conjunctivae are normal. Pupils are equal, round, and reactive to light.  Neck:  Cervical collar in place. Incision with dry dressing in place  Cardiovascular: Normal rate and regular rhythm.  Respiratory: Effort normal and breath sounds normal.  GI: Soft. Bowel sounds are diminished She exhibits no distension. There is no tenderness.  Musculoskeletal:  Tenderness left and right traps.  Neurological: She is alert and oriented to person, place, and time.  Speech clear and more spontaneous today. She was able to follow basic commands without difficulty.  Numbness left hand and forearm as well as sensory deficits LLE. LUE 3+ deltoid, 3+ bicep,tricep, 3+hand, BLE: HF 3, ke 3+, 3+adf/pf .  RUE 4 to 4+/5. RLE: 4 to 4+/5 prox to distal Skin: Skin is warm and dry.  Psychiatric: She has a normal mood and affect. Her behavior is normal. Thought content  normal.   Assessment/Plan: 1. Functional deficits secondary to cervical stenosis with myelopathy which require 3+ hours per day of interdisciplinary therapy in a comprehensive inpatient rehab setting. Physiatrist is providing close team supervision and 24 hour management of active medical problems listed below. Physiatrist and rehab team continue to assess barriers to discharge/monitor patient progress toward functional and medical goals. FIM: FIM - Bathing Bathing Steps Patient Completed: Chest, Right Arm, Left Arm, Abdomen, Front perineal area, Right upper leg, Left upper leg Bathing: 3: Mod-Patient completes 5-7 16f 10 parts or 50-74%  FIM - Upper Body Dressing/Undressing Upper body dressing/undressing steps patient completed: Thread/unthread right bra strap, Thread/unthread left bra strap, Thread/unthread right sleeve of pullover shirt/dresss, Thread/unthread left sleeve of pullover shirt/dress, Put head through opening of pull over shirt/dress Upper body dressing/undressing: 3: Mod-Patient completed 50-74% of tasks FIM - Lower Body Dressing/Undressing Lower body dressing/undressing steps patient completed: Don/Doff left sock, Don/Doff right sock, Thread/unthread right pants leg, Thread/unthread left pants leg Lower body dressing/undressing: 4: Min-Patient completed 75 plus % of tasks  FIM - Toileting Toileting steps completed by patient: Adjust clothing prior to toileting Toileting: 2: Max-Patient completed 1 of 3 steps  FIM - Radio producer Devices: Elevated toilet seat, Environmental consultant, Grab bars Toilet Transfers: 3-From toilet/BSC: Mod A (lift or lower assist)  FIM - IT sales professional Transfer: 3: Chair or W/C > Bed: Mod A (lift or lower assist)  FIM - Locomotion: Wheelchair Locomotion: Wheelchair: 1:  Total Assistance/staff pushes wheelchair (Pt<25%) FIM - Locomotion: Ambulation Locomotion: Ambulation Assistive Devices: Walker -  Rolling Locomotion: Ambulation: 1: Travels less than 50 ft with minimal assistance (Pt.>75%)  Comprehension Comprehension Mode: Auditory Comprehension: 5-Follows basic conversation/direction: With no assist  Expression Expression Mode: Verbal Expression: 5-Expresses basic 90% of the time/requires cueing < 10% of the time.  Social Interaction Social Interaction: 7-Interacts appropriately with others - No medications needed.  Problem Solving Problem Solving: 4-Solves basic 75 - 89% of the time/requires cueing 10 - 24% of the time  Memory Memory: 5-Recognizes or recalls 90% of the time/requires cueing < 10% of the time  Medical Problem List and Plan: 1. Functional deficits secondary to cervical stenosis with myelopathy 2. DVT Prophylaxis/Anticoagulation: Mechanical: Sequential compression devices, below knee Bilateral lower extremities 3. Pain Management: Continue vicodin prn.   K pad to help with muscle spasms.  scheduled robaxin qid.  4. Mood: LCSW to follow for evaluation and support.  5. Neuropsych: This patient is capable of making decisions on her own behalf. 6. Skin/Wound Care: Routine pressure relief measures.  7. Fluids/Electrolytes/Nutrition: Monitor I/O. Encourage Po fluid intake.  8. Hypokalemia:   Continue to replace 9. HTN: Montior BP every 8 hours. Continue Norvasc, coreg and avapro.  10. A fib: Was on Xarelto prior to admission. Continue coreg for rate control. Monitor heart rate with increase in activity HR fluctuating 50-100s 11.  Nausea , constipation, d/c compazine, try zofran, check KUB for poss ileus  LOS (Days) 2 A FACE TO FACE EVALUATION WAS PERFORMED  Genean Adamski E 06/14/2014 7:50 AM

## 2014-06-15 ENCOUNTER — Inpatient Hospital Stay (HOSPITAL_COMMUNITY): Payer: Medicare Other | Admitting: Physical Therapy

## 2014-06-15 ENCOUNTER — Inpatient Hospital Stay (HOSPITAL_COMMUNITY): Payer: Medicare Other

## 2014-06-15 ENCOUNTER — Encounter (HOSPITAL_COMMUNITY): Payer: Self-pay | Admitting: Nurse Practitioner

## 2014-06-15 ENCOUNTER — Encounter (HOSPITAL_COMMUNITY): Payer: Medicare Other | Admitting: Occupational Therapy

## 2014-06-15 ENCOUNTER — Other Ambulatory Visit: Payer: Self-pay

## 2014-06-15 ENCOUNTER — Inpatient Hospital Stay (HOSPITAL_COMMUNITY): Payer: Medicare Other | Admitting: Occupational Therapy

## 2014-06-15 ENCOUNTER — Inpatient Hospital Stay (HOSPITAL_COMMUNITY)
Admission: EM | Admit: 2014-06-15 | Discharge: 2014-07-16 | DRG: 208 | Disposition: E | Payer: Medicare Other | Source: Ambulatory Visit | Attending: Internal Medicine | Admitting: Internal Medicine

## 2014-06-15 DIAGNOSIS — R579 Shock, unspecified: Secondary | ICD-10-CM | POA: Diagnosis present

## 2014-06-15 DIAGNOSIS — I2699 Other pulmonary embolism without acute cor pulmonale: Principal | ICD-10-CM | POA: Diagnosis present

## 2014-06-15 DIAGNOSIS — I4891 Unspecified atrial fibrillation: Secondary | ICD-10-CM | POA: Diagnosis present

## 2014-06-15 DIAGNOSIS — Z79899 Other long term (current) drug therapy: Secondary | ICD-10-CM

## 2014-06-15 DIAGNOSIS — I959 Hypotension, unspecified: Secondary | ICD-10-CM | POA: Insufficient documentation

## 2014-06-15 DIAGNOSIS — Z981 Arthrodesis status: Secondary | ICD-10-CM | POA: Diagnosis not present

## 2014-06-15 DIAGNOSIS — I482 Chronic atrial fibrillation: Secondary | ICD-10-CM | POA: Diagnosis not present

## 2014-06-15 DIAGNOSIS — Z6839 Body mass index (BMI) 39.0-39.9, adult: Secondary | ICD-10-CM

## 2014-06-15 DIAGNOSIS — R0602 Shortness of breath: Secondary | ICD-10-CM | POA: Diagnosis present

## 2014-06-15 DIAGNOSIS — N183 Chronic kidney disease, stage 3 (moderate): Secondary | ICD-10-CM | POA: Diagnosis not present

## 2014-06-15 DIAGNOSIS — G959 Disease of spinal cord, unspecified: Secondary | ICD-10-CM | POA: Diagnosis not present

## 2014-06-15 DIAGNOSIS — I469 Cardiac arrest, cause unspecified: Secondary | ICD-10-CM | POA: Insufficient documentation

## 2014-06-15 DIAGNOSIS — R0600 Dyspnea, unspecified: Secondary | ICD-10-CM

## 2014-06-15 DIAGNOSIS — I129 Hypertensive chronic kidney disease with stage 1 through stage 4 chronic kidney disease, or unspecified chronic kidney disease: Secondary | ICD-10-CM | POA: Diagnosis present

## 2014-06-15 DIAGNOSIS — G43A0 Cyclical vomiting, not intractable: Secondary | ICD-10-CM | POA: Diagnosis not present

## 2014-06-15 DIAGNOSIS — N189 Chronic kidney disease, unspecified: Secondary | ICD-10-CM | POA: Diagnosis present

## 2014-06-15 DIAGNOSIS — Z7901 Long term (current) use of anticoagulants: Secondary | ICD-10-CM

## 2014-06-15 DIAGNOSIS — Z96651 Presence of right artificial knee joint: Secondary | ICD-10-CM | POA: Diagnosis present

## 2014-06-15 DIAGNOSIS — I213 ST elevation (STEMI) myocardial infarction of unspecified site: Secondary | ICD-10-CM | POA: Diagnosis present

## 2014-06-15 DIAGNOSIS — E669 Obesity, unspecified: Secondary | ICD-10-CM | POA: Diagnosis present

## 2014-06-15 DIAGNOSIS — I10 Essential (primary) hypertension: Secondary | ICD-10-CM | POA: Diagnosis present

## 2014-06-15 DIAGNOSIS — J9600 Acute respiratory failure, unspecified whether with hypoxia or hypercapnia: Secondary | ICD-10-CM | POA: Diagnosis present

## 2014-06-15 DIAGNOSIS — M4800 Spinal stenosis, site unspecified: Secondary | ICD-10-CM | POA: Diagnosis present

## 2014-06-15 DIAGNOSIS — G9340 Encephalopathy, unspecified: Secondary | ICD-10-CM | POA: Diagnosis present

## 2014-06-15 LAB — TROPONIN I: TROPONIN I: 0.03 ng/mL (ref <0.031)

## 2014-06-15 LAB — MRSA PCR SCREENING: MRSA BY PCR: NEGATIVE

## 2014-06-15 LAB — GLUCOSE, CAPILLARY: Glucose-Capillary: 181 mg/dL — ABNORMAL HIGH (ref 70–99)

## 2014-06-15 MED ORDER — LIDOCAINE HCL (CARDIAC) 20 MG/ML IV SOLN
INTRAVENOUS | Status: AC
  Filled 2014-06-15: qty 5

## 2014-06-15 MED ORDER — ROCURONIUM BROMIDE 50 MG/5ML IV SOLN
INTRAVENOUS | Status: AC
  Filled 2014-06-15: qty 2

## 2014-06-15 MED ORDER — SODIUM CHLORIDE 0.9 % IV BOLUS (SEPSIS)
250.0000 mL | Freq: Once | INTRAVENOUS | Status: DC
  Administered 2014-06-15: 250 mL via INTRAVENOUS

## 2014-06-15 MED ORDER — ONDANSETRON HCL 4 MG/2ML IJ SOLN
INTRAMUSCULAR | Status: AC
  Administered 2014-06-15: 4 mg
  Filled 2014-06-15: qty 2

## 2014-06-15 MED ORDER — ASPIRIN 325 MG PO TABS
325.0000 mg | ORAL_TABLET | Freq: Every day | ORAL | Status: DC
  Administered 2014-06-15: 325 mg via ORAL
  Filled 2014-06-15 (×2): qty 1

## 2014-06-15 MED ORDER — NITROGLYCERIN 0.4 MG SL SUBL: 0.4000 mg | SUBLINGUAL_TABLET | SUBLINGUAL | Status: DC | PRN

## 2014-06-15 MED ORDER — NITROGLYCERIN 0.4 MG SL SUBL
SUBLINGUAL_TABLET | SUBLINGUAL | Status: AC
  Administered 2014-06-15: 0.4 mg
  Filled 2014-06-15: qty 1

## 2014-06-15 MED ORDER — FUROSEMIDE 10 MG/ML IJ SOLN: 40.0000 mg | Freq: Once | INTRAMUSCULAR | Status: AC

## 2014-06-15 MED ORDER — NITROGLYCERIN 0.3 MG SL SUBL: 0.2000 mg | SUBLINGUAL_TABLET | SUBLINGUAL | Status: DC | PRN

## 2014-06-15 MED ORDER — ETOMIDATE 2 MG/ML IV SOLN
INTRAVENOUS | Status: AC
  Filled 2014-06-15: qty 20

## 2014-06-15 MED ORDER — FUROSEMIDE 10 MG/ML IJ SOLN
INTRAMUSCULAR | Status: AC
  Administered 2014-06-15: 40 mg
  Filled 2014-06-15: qty 4

## 2014-06-15 MED ORDER — MORPHINE SULFATE 2 MG/ML IJ SOLN: 2.0000 mg | Freq: Once | INTRAMUSCULAR | Status: AC

## 2014-06-15 MED ORDER — MORPHINE SULFATE 2 MG/ML IJ SOLN
INTRAMUSCULAR | Status: AC
  Administered 2014-06-15: 2 mg via INTRAMUSCULAR
  Filled 2014-06-15: qty 1

## 2014-06-15 MED ORDER — SUCCINYLCHOLINE CHLORIDE 20 MG/ML IJ SOLN
INTRAMUSCULAR | Status: AC
  Filled 2014-06-15: qty 1

## 2014-06-15 MED ORDER — ONDANSETRON HCL 4 MG/2ML IJ SOLN: 4.0000 mg | Freq: Once | INTRAMUSCULAR | Status: AC

## 2014-06-16 ENCOUNTER — Inpatient Hospital Stay (HOSPITAL_COMMUNITY): Payer: Medicare Other | Admitting: Occupational Therapy

## 2014-06-16 ENCOUNTER — Inpatient Hospital Stay (HOSPITAL_COMMUNITY): Payer: Medicare Other

## 2014-06-16 DIAGNOSIS — I959 Hypotension, unspecified: Secondary | ICD-10-CM | POA: Insufficient documentation

## 2014-06-16 MED FILL — Medication: Qty: 1 | Status: AC

## 2014-06-16 NOTE — Progress Notes (Signed)
Retro readm ur review.

## 2014-06-19 DIAGNOSIS — R109 Unspecified abdominal pain: Secondary | ICD-10-CM

## 2014-07-16 NOTE — Discharge Summary (Signed)
Death Summary  CHINELO BENN LJQ:[id number] DOB: 1940/05/14 DOA: 07-07-14  PCP: Vidal Schwalbe, MD PCP and Dr. Vertell Limber, neurosurgery notified  Admit date: 07-07-14 Date of Death: Jul 07, 2014  Final Diagnoses:  Active Problems:   A-fib   CKD (chronic kidney disease)   Spinal stenosis   Chronic anticoagulation   Obesity (BMI 30-39.9)   Essential hypertension, benign   Cardiac arrest   HPI: Jasmine Hamilton is a 74 y.o. female  With past med hx of spinal stenosis, afib & ckd who was transferred to inpatient rehabilitation on 06/12/14 after undergoing C4-C6 cervical decompression with fusion by neurosurgery after being found to have severe cervical myelopathy with progressive stenosis and weakness. Patient had been complaining of her chronic shortness of breath and overall malaise. On Jul 07, 2022 morning, she was noted to be somewhat hypotensive and decreased level of responsiveness. Oxygen saturations were 94% on 2 L nasal cannula. Hospitalists were called for consultation. Patient started on IV fluids after bolus and rapid response called as well. Labs ordered. Patient given 1 dose of Narcan. Patient's blood pressure did not respond very much to IV fluids staying in the 80s. Critical care notified. Initial plan the patient to stepdown unit, but then she started complaining of chest pain decision made to move her to ICU.  Hospital Course:  Unfortunately, soon after arrival to ICU, patient coded. Looked to be in pulseless electrical activity. Cardiology was on site and critical care immediately notified. CODE BLUE called and CPR initiated. Unfortunately, patient unable to be resuscitated and she expired.   Time: 25 minutes  Signed:  Annita Brod  [facility] 06/16/2014, 9:47 AM

## 2014-07-16 NOTE — Progress Notes (Signed)
Chaplain responded to call from RN to offer family support after death of their mother.  Talked with family briefly. They are very involved in notifying family and friends.  Will stay nearby and check in periodically.  Rev. [place], [place]

## 2014-07-16 NOTE — Progress Notes (Addendum)
[place] PHYSICAL MEDICINE & REHABILITATION     PROGRESS NOTE    Subjective/Complaints: Stomach feels full, no vomiting but nauseated with poor appetite, RN tried compazine without help, no prior hx of bowel problems  Per RN slept well last noc Review of intake, looks decent 50-100% Some SOB, no DOE No abd pain just fullness  ROS otherwise neg   Objective: Vital Signs: Blood pressure 124/78, pulse 77, temperature 97.7 F (36.5 C), temperature source Oral, resp. rate 18, SpO2 94 %. Dg Abd 1 View  06/14/2014   CLINICAL DATA:  Abdominal pain, constipation, spine surgery on April 25  EXAM: ABDOMEN - 1 VIEW  COMPARISON:  None.  FINDINGS: Nonobstructive bowel gas pattern.  Normal colonic stool burden.  Moderate degenerative changes of the lumbar spine.  10 mm sclerotic lesion overlying the right iliac bone, likely reflecting a benign bone island.  IMPRESSION: No evidence of bowel obstruction.  Normal colonic stool burden.   Electronically Signed   By: Julian Hy M.D.   On: 06/14/2014 16:43    Recent Labs  06/13/14 0500  WBC 5.4  HGB 13.4  HCT 40.2  PLT 184    Recent Labs  06/13/14 0500  NA 139  K 3.1*  CL 100  GLUCOSE 128*  BUN 16  CREATININE 0.87  CALCIUM 9.0   CBG (last 3)   Recent Labs  2014/06/28 0521  GLUCAP 181*    Wt Readings from Last 3 Encounters:  06/09/14 101.6 kg (223 lb 15.8 oz)  05/28/14 101.152 kg (223 lb)  03/10/14 103.874 kg (229 lb)    Physical Exam:  Constitutional: She is oriented to person, place, and time. She appears well-developed and well-nourished.  Obese female HENT: Throat is clear. Oral mucosa pink/moist Head: Normocephalic.  Eyes: Conjunctivae are normal. Pupils are equal, round, and reactive to light.  Neck:  Cervical collar in place. Incision with dry dressing in place  Cardiovascular: Normal rate and regular rhythm.  Respiratory: Effort normal and breath sounds normal.  GI: Soft. Bowel sounds are diminished She  exhibits no distension. There is no tenderness.  Musculoskeletal:  Tenderness left and right traps.  Neurological: She is alert and oriented to person, place, and time.  Speech clear and more spontaneous today. She was able to follow basic commands without difficulty.  Numbness left hand and forearm as well as sensory deficits LLE. LUE 3+ deltoid, 3+ bicep,tricep, 3+hand, BLE: HF 3, ke 3+, 3+adf/pf .  RUE 4 to 4+/5. RLE: 4 to 4+/5 prox to distal Skin: Skin is warm and dry.  Psychiatric: She has a normal mood and affect. Her behavior is normal. Thought content normal.   Assessment/Plan: 1. Functional deficits secondary to cervical stenosis with myelopathy   Pt developed more epigastric discomfort ?ACS vs PE EKG showed ST elevation in V2, afib rate 58 Discussed with hospitalist Dr [place] Pink rec SDU Also will ask cardiology to consult  ASA 325mg  po NTG .2mg  mg SL Discussed with [facility] hsopitalist FIM: FIM - Bathing Bathing Steps Patient Completed: Chest, Front perineal area, Abdomen Bathing: 2: Max-Patient completes 3-4 61f 10 parts or 25-49%  FIM - Upper Body Dressing/Undressing Upper body dressing/undressing steps patient completed: Thread/unthread right bra strap, Thread/unthread left bra strap, Thread/unthread right sleeve of pullover shirt/dresss, Thread/unthread left sleeve of pullover shirt/dress, Put head through opening of pull over shirt/dress Upper body dressing/undressing: 3: Mod-Patient completed 50-74% of tasks FIM - Lower Body Dressing/Undressing Lower body dressing/undressing steps patient completed: Don/Doff left sock, Don/Doff right  sock, Thread/unthread right pants leg, Thread/unthread left pants leg Lower body dressing/undressing: 2: Max-Patient completed 25-49% of tasks  FIM - Toileting Toileting steps completed by patient: Performs perineal hygiene Toileting: 2: Max-Patient completed 1 of 3 steps  FIM - Radio producer Devices:  Grab bars, Insurance account manager Transfers: 3-To toilet/BSC: Mod A (lift or lower assist), 3-From toilet/BSC: Mod A (lift or lower assist)  FIM - Control and instrumentation engineer Devices: Copy: 3: Bed > Chair or W/C: Mod A (lift or lower assist), 3: Chair or W/C > Bed: Mod A (lift or lower assist)  FIM - Locomotion: Wheelchair Locomotion: Wheelchair: 1: Total Assistance/staff pushes wheelchair (Pt<25%) FIM - Locomotion: Ambulation Locomotion: Ambulation Assistive Devices: Administrator Ambulation/Gait Assistance: 3: Mod assist Locomotion: Ambulation: 1: Travels less than 50 ft with moderate assistance (Pt: 50 - 74%)  Comprehension Comprehension Mode: Auditory Comprehension: 5-Follows basic conversation/direction: With no assist  Expression Expression Mode: Verbal Expression: 5-Expresses basic 90% of the time/requires cueing < 10% of the time.  Social Interaction Social Interaction: 7-Interacts appropriately with others - No medications needed.  Problem Solving Problem Solving: 4-Solves basic 75 - 89% of the time/requires cueing 10 - 24% of the time  Memory Memory: 5-Recognizes or recalls 90% of the time/requires cueing < 10% of the time  Medical Problem List and Plan: 1. Functional deficits secondary to cervical stenosis with myelopathy 2. DVT Prophylaxis/Anticoagulation: Mechanical: Sequential compression devices, below knee Bilateral lower extremities 3. Pain Management: Continue vicodin prn.   K pad to help with muscle spasms.  scheduled robaxin qid.  4. Mood: LCSW to follow for evaluation and support.  5. Neuropsych: This patient is capable of making decisions on her own behalf. 6. Skin/Wound Care: Routine pressure relief measures.  7. Fluids/Electrolytes/Nutrition: Monitor I/O. Encourage Po fluid intake.  8. Hypokalemia:   Continue to replace 9. HTN: Montior BP every 8 hours. Continue Norvasc, coreg and avapro.  10. A fib: Was on  Xarelto prior to admission. Continue coreg for rate control. Monitor heart rate with increase in activity HR fluctuating 50-100s 11.  Nausea , constipation, d/c compazine, try zofran, check KUB for poss ileus 12.  Some SOB, but very vague mainly GI c/o nausea and epigastric fullness, check EKG, troponin, to eval for atypical angina- this was ordered but symptoms progressed prior to lab draw will make stat LOS (Days) 3 A FACE TO FACE EVALUATION WAS PERFORMED  [name] 2014-07-11 7:29 AM

## 2014-07-16 NOTE — H&P (Signed)
History and Physical  Jasmine Hamilton ESP:[id number] DOB: 1941/01/27 DOA: 07/04/14  Referring physician: Leroy Sea PCP: Vidal Schwalbe, MD   Chief Complaint:  Shortness of breath  HPI: Jasmine Hamilton is a 74 y.o. female  With past med hx of spinal stenosis, afib & ckd who was transferred to inpatient rehabilitation on 06/12/14 after undergoing C4-C6 cervical decompression with fusion by neurosurgery after being found to have severe cervical myelopathy with progressive stenosis and weakness.  Patient had been complaining of her chronic shortness of breath and overall malaise. On 5/1 morning, she was noted to be somewhat hypotensive and decreased level of responsiveness. Oxygen saturations were 94% on 2 L nasal cannula. Hospitalists were called for consultation. Patient started on IV fluids after bolus and rapid response called as well.  Labs ordered. Patient given 1 dose of Narcan. Patient's blood pressure did not respond very much to IV fluids staying in the 80s. Critical care notified. Initial plan the patient to stepdown unit, but then she started complaining of chest pain decision made to move her to ICU.  Review of Systems:  Pt complains of malaise, nausea, weakness. Also commands or shortness of breath, but denies any wheezing. At time of exam, she was not complaining of chest pain.  Pt denies any headache, abdominal pain.  Review of systems otherwise difficult to obtain secondary to decreased level of responsiveness  Past Medical History  Diagnosis Date  . A-fib     a. discovered 12/28/09 - rate controlled;  b. CHA2DS2VASc = 3 (xarelto);  c. 12/2009 Echo: EF 55-60%, no rwma.  . 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 CKD (chronic kidney disease)   . Allergic rhinitis   . Spinal stenosis   . OA (osteoarthritis) of knee   . Shortness of breath dyspnea     with exertion  . Hypertension   . Hypothyroidism    . Anemia   . Family history of adverse reaction to anesthesia     " my mother had too much anesthesia and went into a coma."  . Carpal tunnel syndrome, left   . Frequent urination    Past Surgical History  Procedure Laterality Date  . Knee replacement- right knee, dr Maureen Ralphs    . Colonoscopy w/ biopsies and polypectomy    . Anterior cervical decompression/discectomy fusion 4 levels N/A 06/09/2014    Procedure: CERVICAL THREE-FOUR,CERVICAL FOUR-FIVE,CERVICAL FIVE-SIX,CERVICAL SIX-SEVEN,ANTERIOR CERVIAL DECOMPRESSION/DISCECTOMY/FUSION;  Surgeon: Erline Levine, MD;  Location: [facility];  Service: Neurosurgery;  Laterality: N/A;   Social History:  reports that she has never smoked. She has never used smokeless tobacco. She reports that she does not drink alcohol or use illicit drugs. Patient previously lived alone & is able to participate in activities of daily living with assistance  Allergies  Allergen Reactions  . Codeine Swelling  . Lotensin [Benazepril Hcl] Cough  . Verelan [Verapamil Hcl Er] Swelling    Family History  Problem Relation Age of Onset  . Hypertension Mother   . Diabetes Mother   . Diabetes Brother   . Heart attack Brother   . Kidney disease Sister   . Stroke Mother   . Fainting Father      Prior to Admission medications   Medication Sig Start Date End Date Taking? Authorizing Provider  amLODipine (NORVASC) 5 MG tablet Take 5 mg by mouth daily.    Historical Provider, MD  carvedilol (COREG) 25 MG tablet Take  25 mg by mouth daily.    Historical Provider, MD  furosemide (LASIX) 40 MG tablet Take 40 mg by mouth daily as needed for fluid.     Historical Provider, MD  Multiple Vitamin (MULTIVITAMIN) capsule Take 1 capsule by mouth daily.    Historical Provider, MD  potassium chloride SA (K-DUR,KLOR-CON) 20 MEQ tablet Take 1 tablet by mouth daily as needed (only take with lasix).  06/21/13   Historical Provider, MD  traMADol-acetaminophen (ULTRACET) 37.5-325 MG per  tablet every 4 (four) hours as needed.  11/29/12   Historical Provider, MD  valsartan (DIOVAN) 320 MG tablet Take 320 mg by mouth daily.    Historical Provider, MD  XARELTO 20 MG TABS tablet Take 20 mg by mouth daily with supper. 05/26/14   Historical Provider, MD    Physical Exam: BP 156/105 mmHg  Pulse 128  Temp(Src) 97.3 F (36.3 C) (Oral)  Resp 30  Ht 5\' 3"  (1.6 m)  Wt 100 kg (220 lb 7.4 oz)  BMI 39.06 kg/m2  SpO2 99%  General:  Fatigued, mild distress secondary to weakness and shortness of breath, decreased level of consciousness, but responsive Eyes: Sclera nonicteric, extraocular movements appear intact ENT: Mucous membranes dry Neck: No JVD Cardiovascular: Irregular rhythm, tachycardic Respiratory: Decreased breath sounds throughout Abdomen: Soft, obese, nontender, hypoactive bowel sounds Skin: No skin breaks, tears or lesions Musculoskeletal: No clubbing or cyanosis, trace edema Psychiatric: No acute appearance of psychosis Neurologic: No overt deficits           Labs on Admission:  Basic Metabolic Panel:  Recent Labs Lab 06/13/14 0500  NA 139  K 3.1*  CL 100  CO2 30  GLUCOSE 128*  BUN 16  CREATININE 0.87  CALCIUM 9.0   Liver Function Tests:  Recent Labs Lab 06/13/14 0500  AST 19  ALT 17  ALKPHOS 57  BILITOT 1.0  PROT 6.0  ALBUMIN 3.1*   No results for input(s): LIPASE, AMYLASE in the last 168 hours. No results for input(s): AMMONIA in the last 168 hours. CBC:  Recent Labs Lab 06/13/14 0500  WBC 5.4  NEUTROABS 3.4  HGB 13.4  HCT 40.2  MCV 90.1  PLT 184   Cardiac Enzymes:  Recent Labs Lab 06/26/14 1032  TROPONINI 0.03    BNP (last 3 results) No results for input(s): BNP in the last 8760 hours.  ProBNP (last 3 results) No results for input(s): PROBNP in the last 8760 hours.  CBG:  Recent Labs Lab 2014-06-26 0521  GLUCAP 181*    Radiological Exams on Admission: Dg Abd 1 View  06/14/2014   CLINICAL DATA:  Abdominal pain,  constipation, spine surgery on April 25  EXAM: ABDOMEN - 1 VIEW  COMPARISON:  None.  FINDINGS: Nonobstructive bowel gas pattern.  Normal colonic stool burden.  Moderate degenerative changes of the lumbar spine.  10 mm sclerotic lesion overlying the right iliac bone, likely reflecting a benign bone island.  IMPRESSION: No evidence of bowel obstruction.  Normal colonic stool burden.   Electronically Signed   By: Julian Hy M.D.   On: 06/14/2014 16:43    EKG: Independently reviewed. Rapid A. fib  Assessment/Plan Present on Admission:  . Spinal stenosis status post fusion  . Obesity (BMI 30-39.9): Patient criteria with BMI grade and 30  . A-fib rapid A. fib. Limited in aggressive rate control given hypotension  . CKD (chronic kidney disease) . Essential hypertension, benign Acute encephalopathy: Secondary to shock Hypotension: Questionable sepsis versus volume loss versus  cardiogenic shock versus hypotension from atrial fibrillation. Not much response to initial fluid bolus. Transfer to ICU. Awaiting lab work including hemoglobin, renal function, troponin, lactic acid level  Consultants: Critical care Cardiology  Code Status: Full code  Family Communication: No family present  Disposition Plan: Unfortunately, patient expired soon after admission to ICU. See expiration note  Time spent: 55 minutes  [facility] Pager [phone]

## 2014-07-16 NOTE — Progress Notes (Signed)
Social Work  Discharge Note  The overall goal for the admission was met for:   Discharge location: No - transferred to acute  Length of Stay: No  Discharge activity level: No  Home/community participation: No  Services provided included: MD, RD, PT, OT, RN, TR, Pharmacy and [place]: Private Insurance: [facility] Medicare  Follow-up services arranged: NONE  - pt transferred to acute  Comments (or additional information):  Patient/Family verbalized understanding of follow-up arrangements: NA  Individual responsible for coordination of the follow-up plan: NA  Confirmed correct DME delivered: NA    Jasmine Hamilton

## 2014-07-16 NOTE — Significant Event (Signed)
Rapid Response Event Note  Overview: Time Called: 0918 Arrival Time: 0921 Event Type: Hypotension, Cardiac  Initial Focused Assessment:  Called by primary RN for assistance with patient with low HR (dropped to 25) and low BP 72/48, HR 54, RR  24, 96% on 2 lpm [place].  Upon my arrival to patients room, MD arrived soon after.  Patient sitting up in bed, pale, diaphoretic, c/o abd pain.    Interventions:  EKG obtained results reviewed and and relayed to MD and viewed by Rehab MD, hospitalist and cards consulted, 2 PIV's obtained by IV team.  NS bolus started. Patient placed on zoll monitor, hr 90-106.  BS +, Br [place]inds diminsihed, skin clammy. Labs ordered.  Patient started to c/o SOB and severe chest pain 10/10--Sl nitro given with no relief pain still 10/10, ASA given by primary RN.  BP rechecked 82/60, HR 116, RR 26, 100%.  Spoke with Dr. Maryland Pink relayed findings, patient to go to ICU 2H.  Lab at bedside.  VS 109, 90/75, 22, 100%, rechecked prior to transporting pt, 81/49, 96, 28, 97%.   Event Summary:  Patient transported to 2 H 3 via bed with zoll monitor and oxygen in place.  Report given to ICU RN by Colletta Maryland, RN.     at      at          [facility], [place]

## 2014-07-16 NOTE — Consult Note (Signed)
Called by Dr. Maryland Pink for a patient being transferred to the ICU from rehab.  Had a C4-C5 fusion.  Started having chest pain and SOB.  Transferred down to the ICU.  PCCM was asked to consult by TRH.  Upon arrival to the ICU the patient developed PEA cardiac arrest.  Code blue was called.  Myself and cardiology responded.  Chart was reviewed thoroughly.  Suspect a primary cardiac event here.  No acidosis noted.  Patient was on anti-coagulants making PE less likely.  Sliding lung sign seen on U/S so not likely to be PTX.  Patient was intubated, please see code note.  Patient did not survive the code.  Daughter was contacted and informed of information.  The patient is critically ill with multiple organ systems failure and requires high complexity decision making for assessment and support, frequent evaluation and titration of therapies, application of advanced monitoring technologies and extensive interpretation of multiple databases.   Critical Care Time devoted to patient care services described in this note is  35  Minutes. This time reflects time of care of this signee Dr Jennet Maduro. This critical care time does not reflect procedure time, or teaching time or supervisory time of PA/NP/Med student/Med Resident etc but could involve care discussion time.  Rush Farmer, M.D. [facility] Pulmonary/Critical Care Medicine. Pager: [phone]. After hours pager: [phone].

## 2014-07-16 NOTE — Consult Note (Signed)
CARDIOLOGY CONSULT NOTE   Patient ID: Jasmine Hamilton MRN: [id number], DOB/AGE: 74/18/1942   Admit date: Jul 04, 2014 Date of Consult: 07/04/14   Primary Physician: Vidal Schwalbe, MD Primary Cardiologist: Jerilynn Mages. Gillian Shields, MD   Pt. Profile  74 y/o female with h/o persistent afib who is admitted to ICU 2/2 acute dyspnea, chest pain, and nausea this AM after cervical discectomy/decompression/fusion on 4/25.  Problem List  Past Medical History  Diagnosis Date  . A-fib     a. discovered 12/28/09 - rate controlled;  b. CHA2DS2VASc = 3 (xarelto);  c. 12/2009 Echo: EF 55-60%, no rwma.  . 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 CKD (chronic kidney disease)   . Allergic rhinitis   . Spinal stenosis   . OA (osteoarthritis) of knee   . Shortness of breath dyspnea     with exertion  . Hypertension   . Hypothyroidism   . Anemia   . Family history of adverse reaction to anesthesia     " my mother had too much anesthesia and went into a coma."  . Carpal tunnel syndrome, left   . Frequent urination     Past Surgical History  Procedure Laterality Date  . Knee replacement- right knee, dr Maureen Ralphs    . Colonoscopy w/ biopsies and polypectomy    . Anterior cervical decompression/discectomy fusion 4 levels N/A 06/09/2014    Procedure: CERVICAL THREE-FOUR,CERVICAL FOUR-FIVE,CERVICAL FIVE-SIX,CERVICAL SIX-SEVEN,ANTERIOR CERVIAL DECOMPRESSION/DISCECTOMY/FUSION;  Surgeon: Erline Levine, MD;  Location: [facility];  Service: Neurosurgery;  Laterality: N/A;    Allergies  Allergies  Allergen Reactions  . Codeine Swelling  . Lotensin [Benazepril Hcl] Cough  . Verelan [Verapamil Hcl Er] Swelling   HPI   74 y/o female with the above problem list.  She was admitted 4/25 secondary to cervical cord compression and spinal stenosis with arm pain.  She underwent successful cervical discectomy/decompression/fusion on 4/25.  Post-op course  was relatively uneventful and she was transferred to rehab on 4/28.  Per notes, she was Jordan well but this morning initially complained of abd fullness followed by acute dyspnea, chest pain, and nausea.  Medicine was contacted and arrangements were made for Tx to [place].  Upon arrival to 2H03 (CCU), she continued to c/o severe chest pain and repeatedly stated that she could not breathe.  She was hypertensive with pressures in the 150-160/100-110 range.  ECG showed afib, 114, rbbb, no acute st/t changes.  She was noted to have bibasilar crackles.  BiPap was applied and she was treated with lasix 40 iv x 1, morphine 2 mg IV x 1, and zofran 4 mg IV x 1.  HR stabilized and she was less symptomatic but then developed sinus brady with loss of pulse.  Code blue called.  See code note below.  Inpatient Medications  . etomidate      . lidocaine (cardiac) 100 mg/41ml      . rocuronium      . succinylcholine        Family History Family History  Problem Relation Age of Onset  . Hypertension Mother   . Diabetes Mother   . Diabetes Brother   . Heart attack Brother   . Kidney disease Sister   . Stroke Mother   . Fainting Father      Social History History   Social History  . Marital Status: Widowed    Spouse Name: N/A  . Number of Children: N/A  .  Years of Education: N/A   Occupational History  . Not on file.   Social History Main Topics  . Smoking status: Never Smoker   . Smokeless tobacco: Never Used  . Alcohol Use: No  . Drug Use: No  . Sexual Activity: Not on file   Other Topics Concern  . Not on file   Social History Narrative     Review of Systems  General:  No chills, fever, night sweats or weight changes.  Cardiovascular:  +++ chest pain, +++ dyspnea on exertion, no edema, +++ orthopnea, no palpitations, paroxysmal nocturnal dyspnea. Dermatological: No rash, lesions/masses Respiratory: No cough, +++ dyspnea Urologic: No hematuria, dysuria Abdominal:   +++ nausea, no  vomiting, diarrhea, bright red blood per rectum, melena, or hematemesis Neurologic:  No visual changes, wkns, changes in mental status. All other systems reviewed and are otherwise negative except as noted above.  Physical Exam  See ccu documentation for vs.  General: dyspneic, inc wob. Psych: flat affect. Neuro: Alert and oriented X 3. Moves all extremities spontaneously. HEENT: Normal  Neck: Supple.  Difficult to assess further 2/2 C collar. Lungs:  Resp labored, bibasilar crackles. Heart: ir, ir  no s3, s4, or murmurs. Abdomen: Soft, non-tender, non-distended, BS + x 4.  Extremities: No clubbing, cyanosis or edema. DP/PT/Radials 2+ and equal bilaterally.  Labs   Lab Results  Component Value Date   WBC 5.4 06/13/2014   HGB 13.4 06/13/2014   HCT 40.2 06/13/2014   MCV 90.1 06/13/2014   PLT 184 06/13/2014    Recent Labs Lab 06/13/14 0500  NA 139  K 3.1*  CL 100  CO2 30  BUN 16  CREATININE 0.87  CALCIUM 9.0  PROT 6.0  BILITOT 1.0  ALKPHOS 57  ALT 17  AST 19  GLUCOSE 128*   Radiology/Studies  Dg Cervical Spine 2-3 Views  06/09/2014   CLINICAL DATA:  C3 through C7 anterior cervical disc fusion.  EXAM: CERVICAL SPINE - 2-3 VIEW  COMPARISON:  MRI scan of May 31, 2014.  FINDINGS: Two intraoperative cross-table portable lateral views of the cervical spine were obtained. The first image demonstrates surgical probes indicating the anterior portions of the C2-3 and C3-4 disc spaces. The second image demonstrates the patient be status post surgical anterior fusion with superior end at C3. Due to overlying soft tissue, the inferior extent of the fusion plate cannot be identified. Only the first 5 cervical vertebra are completely visualized. Interbody spacers are noted as well.  IMPRESSION: Surgical localization as described above for surgical anterior fusion of cervical spine.   Electronically Signed   By: Marijo Conception, M.D.   On: 06/09/2014 12:30   Dg Abd 1  View  06/14/2014   CLINICAL DATA:  Abdominal pain, constipation, spine surgery on April 25  EXAM: ABDOMEN - 1 VIEW  COMPARISON:  None.  FINDINGS: Nonobstructive bowel gas pattern.  Normal colonic stool burden.  Moderate degenerative changes of the lumbar spine.  10 mm sclerotic lesion overlying the right iliac bone, likely reflecting a benign bone island.  IMPRESSION: No evidence of bowel obstruction.  Normal colonic stool burden.   Electronically Signed   By: Julian Hy M.D.   On: 06/14/2014 16:43   Mr Cervical Spine Wo Contrast  05/31/2014   ADDENDUM REPORT: 05/31/2014 10:44  ADDENDUM: Study discussed by telephone with Dr. Kary Kos On 05/31/2014 at 1041 hours.   Electronically Signed   By: Genevie Ann M.D.   On: 05/31/2014 10:44  05/31/2014   CLINICAL DATA:  74 year old female with cervical myelopathy. Bike spread pain and numbness in the spine, upper and lower extremities. Severe right shoulder pain for several months. No recent injury. Subsequent encounter.  EXAM: MRI CERVICAL SPINE WITHOUT CONTRAST  TECHNIQUE: Multiplanar, multisequence MR imaging of the cervical spine was performed. No intravenous contrast was administered.  COMPARISON:  Cervical spine MRI 12/06/2013, and earlier brain MRIs.  FINDINGS: Chronic straightening and mild reversal of cervical lordosis. Widespread cervical spondylolisthesis is chronic and maximal at C4-C5, measuring up to 3 mm. Mild associated endplate marrow edema at that level is chronic. No acute osseous abnormality identified.  Cervicomedullary junction is within normal limits. Chronic patchy T2 hyperintensity in the pons.  Chronic cervical spinal stenosis with cord mass effect. There is now abnormal T2 and STIR cord signal centered at the C4-C5 level. See additional details of spinal stenosis below.  Stable and negative paraspinal soft tissues.  C2-C3: Chronic moderate to severe left facet hypertrophy. Stable small central disc protrusion or posterior longitudinal  ligament thickening. Mild spinal stenosis with no spinal cord mass effect is stable. Stable moderate left C3 foraminal stenosis.  C3-C4: Increased central disc protrusion seen on series 6, image 8. Chronic underlying disc bulge and severe left facet hypertrophy. Increased spinal stenosis and mass effect on the ventral spinal cord. No definite cord signal abnormality. Severe left C4 foraminal stenosis has not significantly changed.  C4-C5: Chronic anterolisthesis. Chronic broad-based disc protrusion/pseudo disc. Moderate ligament flavum hypertrophy. Disc material appears increased on series 6, image 12. Spinal stenosis and spinal cord mass effect appear increased, with new cord signal abnormality as detailed above. Moderate to severe facet hypertrophy. Severe left C5 foraminal stenosis appears increased. Stable moderate right foraminal stenosis.  C5-C6: Chronic disc space loss and retrolisthesis. Circumferential disc osteophyte complex with ligament flavum hypertrophy. Chronic spinal stenosis but spinal cord mass effect appears mildly increased as seen on series 6, image 16. Severe bilateral C6 foraminal stenosis has not significantly changed.  C6-C7: Chronic disc osteophyte complex and moderate facet hypertrophy greater on the left. Moderate ligament flavum hypertrophy. Spinal stenosis without definite spinal cord mass effect appears stable. Severe left and moderate right C7 foraminal stenosis are stable.  C7-T1: Chronic mild anterolisthesis with broad-based disc/pseudo disc and moderate to severe ligament flavum hypertrophy. Stable spinal stenosis without spinal cord mass effect or signal abnormality. Severe left and mild right C8 foraminal stenosis are stable.  No upper thoracic spinal stenosis.  IMPRESSION: 1. Chronic widespread cervical spondylolisthesis with advanced disc and ligament flavum degeneration. 2. Chronic but progressed cervical spinal stenosis at several levels, most notably C4-C5 where there is  increased mass effect on the spinal cord and now with clear evidence of cord myelomalacia or edema. 3. Progressed stenosis at C3-C4 and C5-C6 with increased mass effect on the cord but no cord signal abnormality. 4. Widespread moderate and severe neural foraminal stenosis, generally worse on the left, and appears increased at the C5 nerve level.  Electronically Signed: By: Genevie Ann M.D. On: 05/31/2014 10:17    ECG  Afib, 114, rbbb, no acute st/t changes.  ASSESSMENT AND PLAN  1.  Acute resp failure:  See above.  Acute decompensation req code blue and intubation.  See Attending note below.  Signed, Murray Hodgkins, NP 22-Jun-2014, 11:06 AM   Patient seen and examined independently. Emeline Gins, NP note reviewed carefully - agree with his assessment and plan. I have edited the note based on my findings.   Patient  brought to ICU for CP and respiratory distress. Some improvement with IV lasix. On arrival to CCU quickly decompensated and developed PEA arrest. I responded immediately in conjunction with Dr. Nelda Marseille of CCM team. She was intubated and CPR begun immediately. Given multiple rounds of epi, atropine and bicarb. Transiently had SROC but then lost her pulse again. Unable to get pulse back despite aggressive measures. Pronounced at 11:17. Family notified.  Critical Care Time devoted to patient care services described in this note is 45 Minutes.  Larra Crunkleton,MD 11:25 AM

## 2014-07-16 NOTE — Discharge Summary (Signed)
Physician Discharge Summary  Patient ID: Jasmine Hamilton MRN: [id number] DOB/AGE: Jun 23, 1940 74 y.o.  Admit date: 06/12/2014 Discharge date: Jul 07, 2014  Discharge Diagnoses:  Principal Problem:   Arterial hypotension Active Problems:   CKD (chronic kidney disease)   Essential hypertension, benign   Cervical myelopathy   Abdominal pain   Discharged Condition: Guarded.   Significant Diagnostic Studies: Dg Cervical Spine 2-3 Views  06/09/2014   CLINICAL DATA:  C3 through C7 anterior cervical disc fusion.  EXAM: CERVICAL SPINE - 2-3 VIEW  COMPARISON:  MRI scan of May 31, 2014.  FINDINGS: Two intraoperative cross-table portable lateral views of the cervical spine were obtained. The first image demonstrates surgical probes indicating the anterior portions of the C2-3 and C3-4 disc spaces. The second image demonstrates the patient be status post surgical anterior fusion with superior end at C3. Due to overlying soft tissue, the inferior extent of the fusion plate cannot be identified. Only the first 5 cervical vertebra are completely visualized. Interbody spacers are noted as well.  IMPRESSION: Surgical localization as described above for surgical anterior fusion of cervical spine.   Electronically Signed   By: Marijo Conception, M.D.   On: 06/09/2014 12:30   Dg Abd 1 View  06/14/2014   CLINICAL DATA:  Abdominal pain, constipation, spine surgery on April 25  EXAM: ABDOMEN - 1 VIEW  COMPARISON:  None.  FINDINGS: Nonobstructive bowel gas pattern.  Normal colonic stool burden.  Moderate degenerative changes of the lumbar spine.  10 mm sclerotic lesion overlying the right iliac bone, likely reflecting a benign bone island.  IMPRESSION: No evidence of bowel obstruction.  Normal colonic stool burden.   Electronically Signed   By: Julian Hy M.D.   On: 06/14/2014 16:43    Labs:  Basic Metabolic Panel:  Recent Labs Lab 06/13/14 0500  NA 139  K 3.1*  CL 100  CO2 30  GLUCOSE 128*  BUN 16   CREATININE 0.87  CALCIUM 9.0    CBC:  Recent Labs Lab 06/13/14 0500  WBC 5.4  NEUTROABS 3.4  HGB 13.4  HCT 40.2  MCV 90.1  PLT 184    CBG:  Recent Labs Lab 07/07/14 0521  GLUCAP 181*    Brief HPI:   Jasmine Hamilton is a 74 y.o. female with history of A fib, DOE, CKD, BUE weakness and numbness with difficulty walking due to severe cervical myelopathy with progressive cervical stenosis and weakness. Work up revealed cervical cord compression with spondylosis and patient elected to undergo C4-C6 cervical decompression with fusion and PEEK cages on 06/09/14 by Dr. Vertell Limber. Drain removed yesterday without difficulty. Therapy ongoing and CIR recommended for follow by MD and Rehab team.    Hospital Course: Jasmine Hamilton was admitted to rehab 06/12/2014 for inpatient therapies to consist of PT, ST and OT at least three hours five days a week. Past admission physiatrist, therapy team and rehab RN have worked together to provide customized collaborative inpatient rehab. Blood pressures were monitored on tid basis and were reasonably controlled.  NS was contacted for input on Xarelto and recommended holding anticoagulation for  5 days.  She had issues with nausea with constipation and KUB was negative for ileus or obstipation. Compazine was changed to zofran to help with nausea. Therapy was ongoing and patient required moderate assistance with mobility and mod to max assistance with ADL tasks. On 05/01, patient developed SOB with hypotension and decreased LOC. Cardiac enzymes and EKG were ordered to rule  out cardiac event.  Dr. [place] Pink was consulted for transfer of patient to acute services for management of sepsis v/s cardiogenic shock.  Patient was discharged to step down unit on 07-03-14    Disposition:  Transfer step down unit.        Medication List    ASK your doctor about these medications        amLODipine 5 MG tablet  Commonly known as:  NORVASC  Take 5 mg by  mouth daily.     carvedilol 25 MG tablet  Commonly known as:  COREG  Take 25 mg by mouth daily.     furosemide 40 MG tablet  Commonly known as:  LASIX  Take 40 mg by mouth daily as needed for fluid.     multivitamin capsule  Take 1 capsule by mouth daily.     potassium chloride SA 20 MEQ tablet  Commonly known as:  K-DUR,KLOR-CON  Take 1 tablet by mouth daily as needed (only take with lasix).     traMADol-acetaminophen 37.5-325 MG per tablet  Commonly known as:  ULTRACET  every 4 (four) hours as needed.     valsartan 320 MG tablet  Commonly known as:  DIOVAN  Take 320 mg by mouth daily.         SignedBary Leriche 06/19/2014, 5:09 PM

## 2014-07-16 NOTE — Progress Notes (Signed)
Occupational Therapy Session Note  Patient Details  Name: Jasmine Hamilton MRN: [id number] Date of Birth: 01/20/1941  Today's Date: 24-Jun-2014 OT Individual Time:  -   0800-0900   (60 min)      Short Term Goals: Week 1:  OT Short Term Goal 1 (Week 1): Pt will complete toileting task with supervision OT Short Term Goal 2 (Week 1): Pt will dress LB with supervision OT Short Term Goal 3 (Week 1): Pt will dress UB mod I OT Short Term Goal 4 (Week 1): Pt will tolerate 15 minutes of functional sitting task without need for rest break. Week 2:     Skilled Therapeutic Interventions/Progress Updates: .  Addressed bed mobility, pain management, BADL Pt received in bed.  Pt has gas pain and continues to report significant nausea and discomfort in abdomen  Pt moving from one side of the bed to the other in discomfort.    Provided crackers and ginger ale for comfort measures.  Pt burping loudly.  She washed face and brushed gums and tongue to provide better oral hygiene.   Pt. Unable to tolerate bed flat to check depends for stool.  Informed RN who was coming in to check on her.   about pt's status.   Therapy Documentation Precautions:  Precautions Precautions: Cervical, Fall Required Braces or Orthoses: Cervical Brace Cervical Brace: Hard collar, At all times Restrictions Weight Bearing Restrictions: No   Pain:  Gas discomfort on abdomen      See FIM for current functional status  Therapy/Group: Individual Therapy  Lisa Roca 06-24-2014, 7:56 AM

## 2014-07-16 NOTE — Progress Notes (Signed)
1015 Patient transferred to ICU. Report handoff given to Zenovia Jordan, RN.

## 2014-07-16 NOTE — Progress Notes (Signed)
64 RN and NT obtained routine EKG from patient per MD order.  Results revealed bradycardia with HR fluctuations in the 30's-60's with low BP (see flow sheets).  RR and Charge RN notified of EKG changes at approximately 0930. Patient AOX3 with moaning and complaints of nausea (ongoing). 2L-O2 placed on patient via nasal cannula for patient comfort.  RR arrived to assist attending RN at approx. 5409.  Attending MD (Kirsteins) on the scene and aware of changes in patient vitals and EKG changes.  0945 AC arrived to assist with patient care and eventual transfer to acute.  IV access established and line patent in R.FA. 1000 250 cc fluid bolus administered over 1 hr.  Per MD verbal order. Sublingual Nitro and 325 mg po aspirin given (MAR for medication administration records). Patient transferred to ICU with RR RN and attending RN for report handoff.

## 2014-07-16 NOTE — Progress Notes (Signed)
Jasmine Hamilton on unit and asked to see pt. She was short of breath, 10/10 chest pain, anxious, cold, clammy. Meds ordered and given. At 1058 heart rate dropped and pt. Ceased breathing. Code called. Drs. Yacoub and Bensimhon responded. See code sheet for details Daughter called. Pronounced at 1117.

## 2014-07-16 NOTE — Procedures (Signed)
Intubation Procedure Note Jasmine Hamilton [id number] 1940-09-13  Procedure: Intubation Indications: Respiratory insufficiency  Procedure Details Consent: Unable to obtain consent because of emergent medical necessity. Time Out: Verified patient identification, verified procedure, site/side was marked, verified correct patient position, special equipment/implants available, medications/allergies/relevent history reviewed, required imaging and test results available.  Performed  Maximum sterile technique was used including gloves, hand hygiene and mask.  MAC    Evaluation Hemodynamic Status: BP stable throughout; O2 sats: stable throughout Patient's Current Condition: stable Complications: No apparent complications Patient did tolerate procedure well. Chest X-ray ordered to verify placement.  CXR: pending.   YACOUB,WESAM 19-Jun-2014

## 2014-07-16 NOTE — Procedures (Signed)
CPR Note  Code blue called for PEA.  Patient intubated, bicarb, epi and atropine given.  Please see code sheet for duration and timing of medications.  CPR was performed.  No return of spontaneous circulation by end of the code.  Patient was pronounced dead.  Daughter notified.  Rush Farmer, M.D. [facility] Pulmonary/Critical Care Medicine. Pager: [phone]. After hours pager: [phone].

## 2014-07-16 DEATH — deceased

## 2016-10-18 IMAGING — MR MR THORACIC SPINE W/O CM
4 of 11 series · 17 of 48 positions shown · non-contrast
Comparison: Lumbar spine MRI 05/07/2007

CLINICAL DATA: Thoracic spinal stenosis. Pain progressive proximal
lower extremity weakness and distal upper extremity weakness with
numbness and tingling.

EXAM:
MRI THORACIC AND LUMBAR SPINE WITHOUT CONTRAST
TECHNIQUE: Multiplanar and multiecho pulse sequences of the thoracic and lumbar
spine were obtained without intravenous contrast.

[Series 5: T2 · sagittal · 3.0mm · 0.62mm/px · 2 of 13 slices shown (1 of 4)]
[im 1/13]
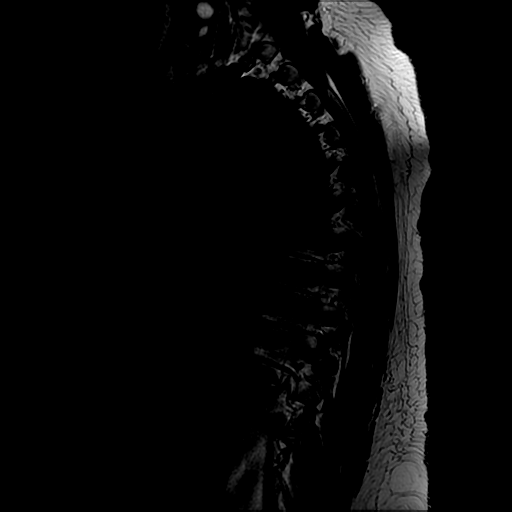
[im 13/13]
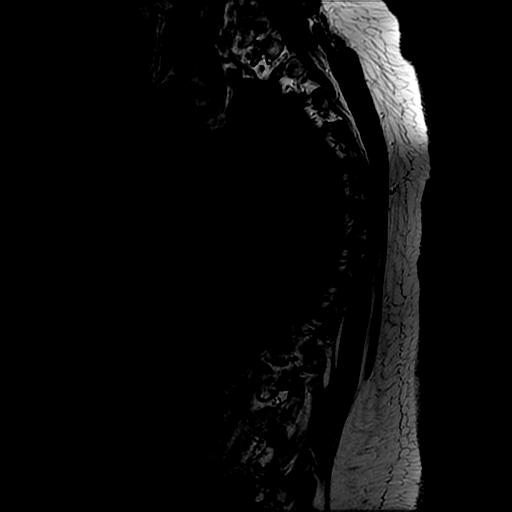

[Series 7: T2 · axial · 4.0mm · 0.43mm/px · z∈[-287,-102]mm · 8 of 36 slices shown (2 of 4)]
[im 1/36]
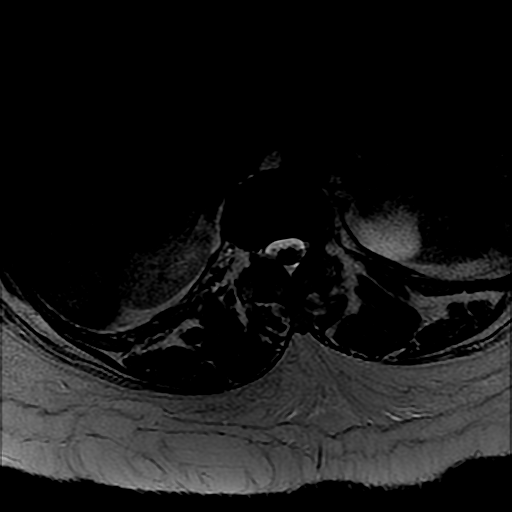
[im 6/36]
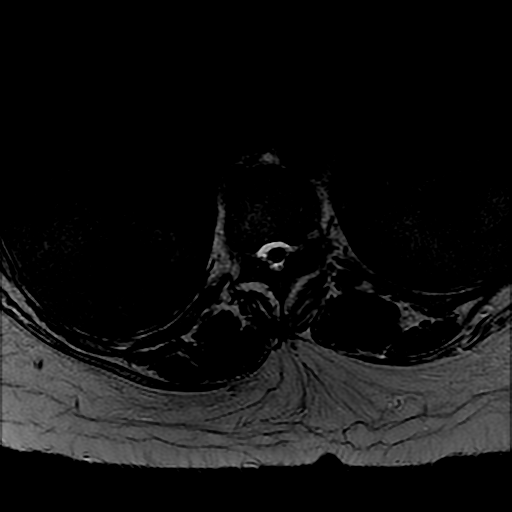
[im 11/36]
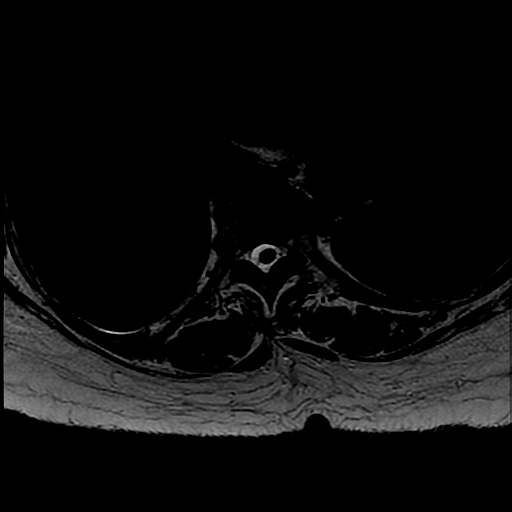
[im 16/36]
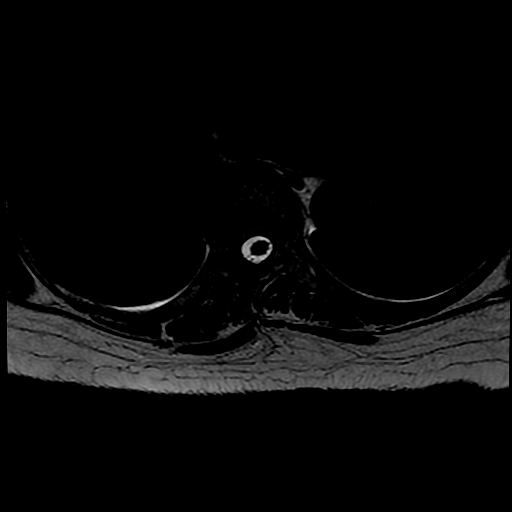
[im 21/36]
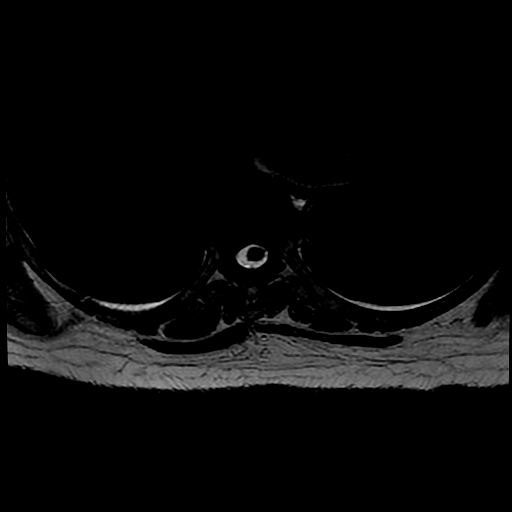
[im 26/36]
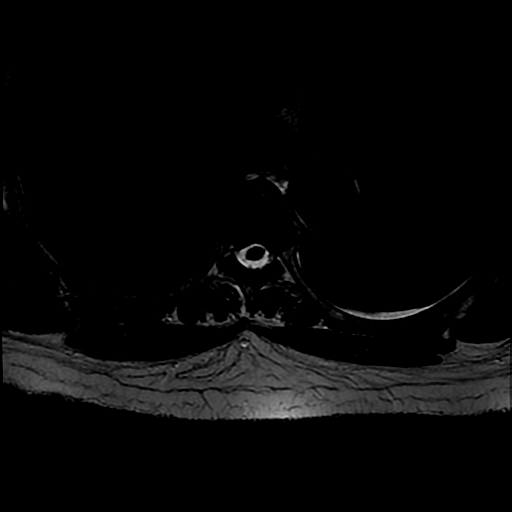
[im 31/36]
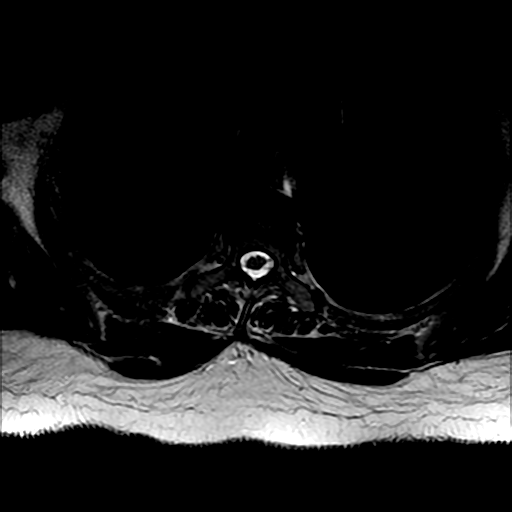
[im 36/36]
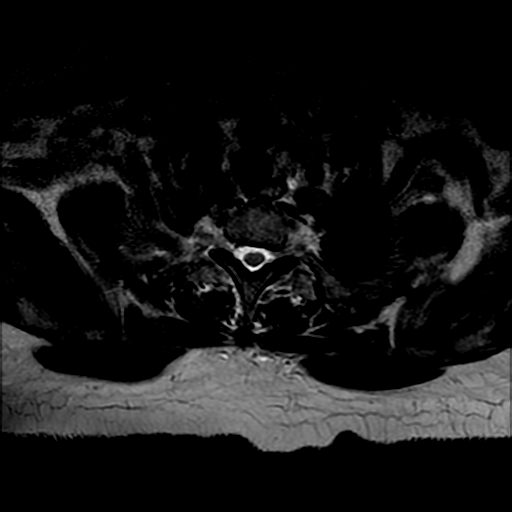

[Series 12: T2 · sagittal · 4.0mm · 0.55mm/px · 3 of 13 slices shown (3 of 4)]
[im 1/13]
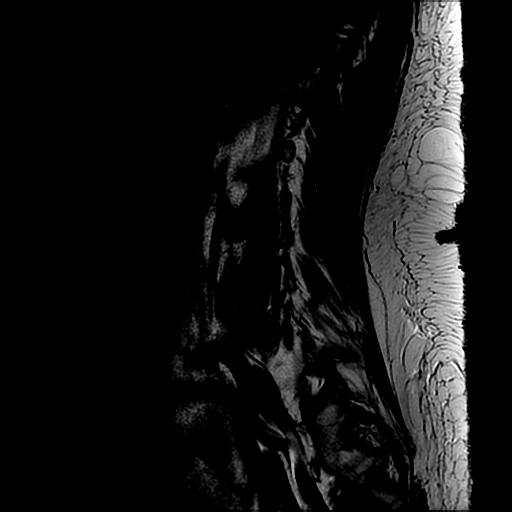
[im 7/13]
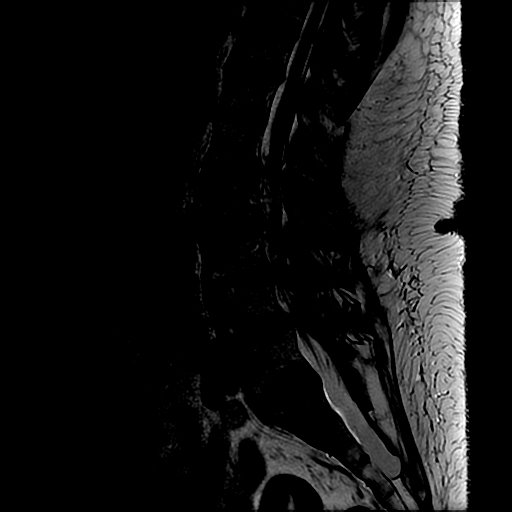
[im 13/13]
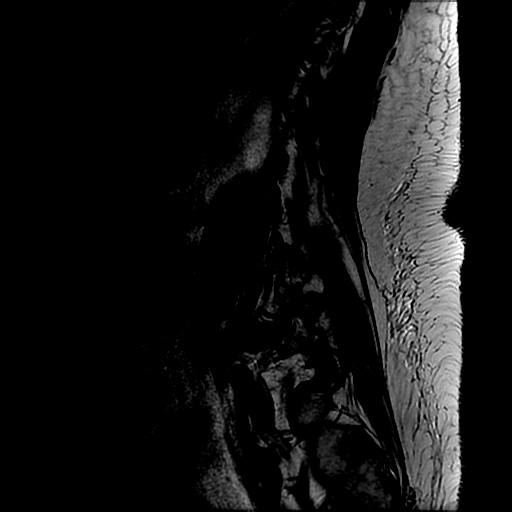

[Series 14: T2 · axial · 4.0mm · 0.39mm/px · z∈[-516,-375]mm · 4 of 32 slices shown (4 of 4)]
[im 1/32]
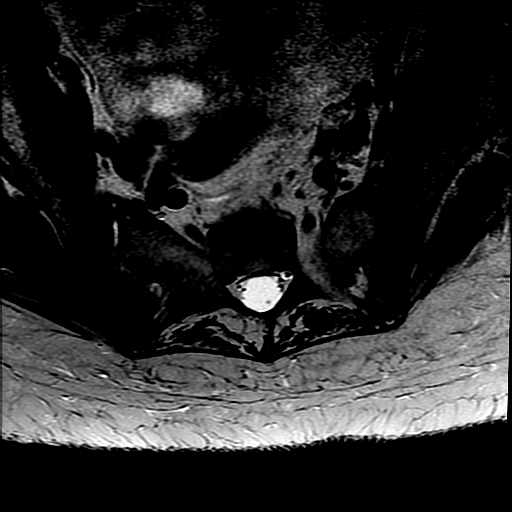
[im 6/32]
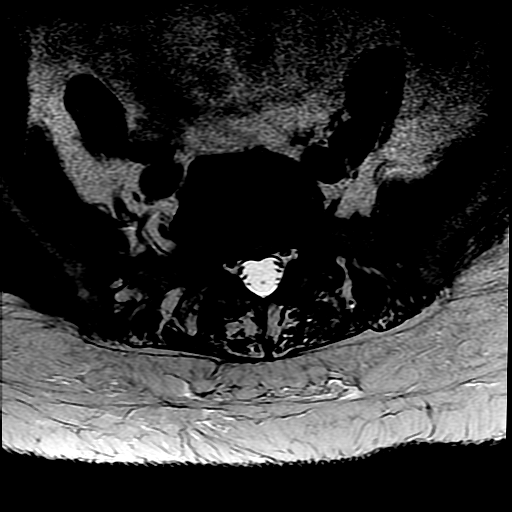
[im 16/32]
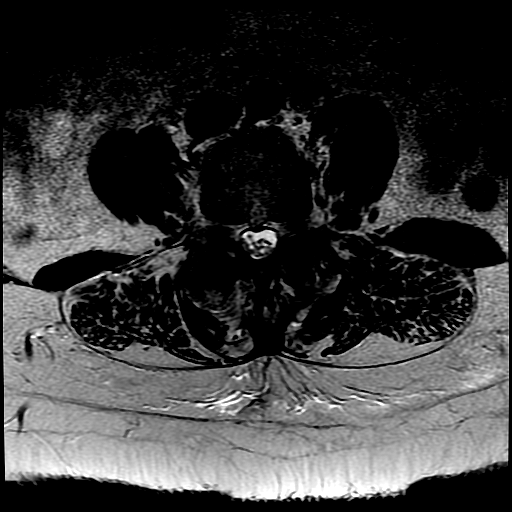
[im 26/32]
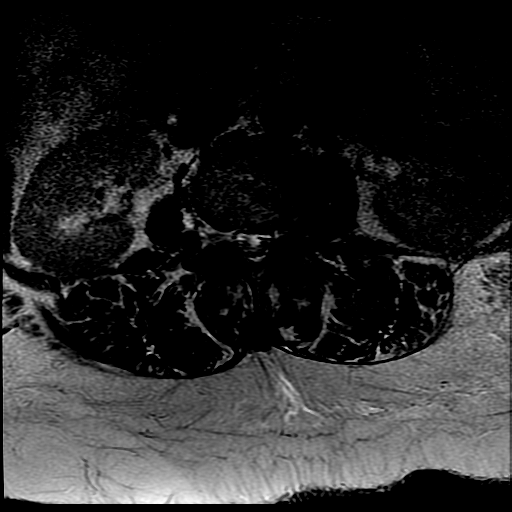

[17 of 48 positions shown; findings below may reference images not displayed]

FINDINGS: MR THORACIC SPINE FINDINGS

Limited visualization of the cervical spine for counting purposes
again demonstrates reversal of the normal cervical lordosis with
grade 1 anterolisthesis of C3 on C4, C4 on C5, and C7 on T1 and
grade 1 retrolisthesis of C5 on C6, similar to 12/06/2013 cervical
spine MRI. Associated disc and facet degeneration and spinal
stenosis in the cervical spine is more fully evaluated on the prior
MRI.

There is no thoracic listhesis. There is mild thoracic
dextroscoliosis. Thoracic vertebral body heights are preserved
without evidence of compression fracture. A T8 inferior endplate
Schmorl's node is noted. Vertebral bone marrow signal is within
normal limits without evidence of edema. Thoracic spinal cord is
normal caliber and signal.

Minimal disc bulging is present at T1-2, T6-7, T7-8, and T8-9
without stenosis. There is mild spinal stenosis at T10-11 and T11-12
due to mild disc bulging and moderate facet and ligamentum flavum
hypertrophy. Right-sided neural foraminal stenosis is moderate at
T10-11 and severe at T11-12. There are trace bilateral pleural
effusions. A 3.1 cm T2 hyperintense lesion in the upper pole the
left kidney likely represents a cyst.

MR LUMBAR SPINE FINDINGS

Grade 1 anterolisthesis of L3 on L4 and L4 on L5 is unchanged.
Vertebral body heights are preserved. There is diffuse lumbar disc
desiccation. There is mild-to-moderate disc space narrowing from
L[DATE]-L4-5, with narrowing at L1-2 and L2-3 being new from the prior
study. Mild discogenic marrow edema is noted at L1-2 and L2-3. The
conus medullaris is normal in signal in terminates CT the superior
aspect of L2. Disorganized appearance of the cauda equina in the mid
lumbar spine is likely secondary 2 stenosis at L3-4. T2 hyperintense
lesion in the left kidney likely represents a cyst.

L1-2: New circumferential disc bulging and moderate facet and
ligamentum flavum hypertrophy resulting in new, moderate spinal
stenosis and moderate bilateral neural foraminal stenosis.

L2-3: New circumferential disc bulging and moderate facet and
ligamentum flavum hypertrophy result in new, moderate spinal
stenosis and moderate bilateral neural foraminal stenosis.

L3-4: Listhesis, disc uncovering/ disc bulging, and severe facet and
ligamentum flavum hypertrophy resulting in increased, severe spinal
stenosis and mild-to-moderate right greater than left neural
foraminal stenosis.

L4-5: Listhesis, disc uncovering/disc bulging, and severe facet and
ligamentum flavum hypertrophy resulting in slightly increased,
severe spinal stenosis and moderate bilateral neural foraminal
stenosis.

L5-S1: Mild disc bulging and severe right and moderate left facet
hypertrophy results in mild bilateral neural foraminal stenosis,
similar to prior.
IMPRESSION: MR THORACIC SPINE IMPRESSION

1. Mild thoracic disc and facet degeneration, most notable in the
lower thoracic spine where there is mild spinal stenosis and
moderate to severe right neural foraminal stenosis at T10-11 and
T11-12.
2. Trace bilateral pleural effusions.

MR LUMBAR SPINE IMPRESSION

Progressive, advanced disc degeneration and facet arthrosis
throughout the lumbar spine resulting in increased, severe spinal
stenosis at L3-4 and L4-5 and new, moderate spinal stenosis at L1-2
and L2-3.
